# Patient Record
Sex: Female | Born: 1980
Health system: Southern US, Community
[De-identification: ages and names within clinical notes are randomized; demographics above are authoritative.]

## PROBLEM LIST (undated history)

## (undated) DIAGNOSIS — N921 Excessive and frequent menstruation with irregular cycle: Secondary | ICD-10-CM

## (undated) DIAGNOSIS — I10 Essential (primary) hypertension: Secondary | ICD-10-CM

## (undated) DIAGNOSIS — D509 Iron deficiency anemia, unspecified: Secondary | ICD-10-CM

## (undated) DIAGNOSIS — M797 Fibromyalgia: Secondary | ICD-10-CM

## (undated) HISTORY — DX: Iron deficiency anemia, unspecified: D50.9

## (undated) HISTORY — PX: ABDOMINAL HYSTERECTOMY: SHX81

## (undated) HISTORY — DX: Excessive and frequent menstruation with irregular cycle: N92.1

---

## 2014-04-10 ENCOUNTER — Encounter: Payer: Self-pay | Admitting: Sports Medicine

## 2014-04-10 ENCOUNTER — Ambulatory Visit (INDEPENDENT_AMBULATORY_CARE_PROVIDER_SITE_OTHER): Payer: Managed Care, Other (non HMO) | Admitting: Sports Medicine

## 2014-04-10 VITALS — BP 134/87 | HR 82 | Ht 69.0 in | Wt 238.0 lb

## 2014-04-10 DIAGNOSIS — I10 Essential (primary) hypertension: Secondary | ICD-10-CM

## 2014-04-10 DIAGNOSIS — D509 Iron deficiency anemia, unspecified: Secondary | ICD-10-CM

## 2014-04-10 DIAGNOSIS — E611 Iron deficiency: Secondary | ICD-10-CM

## 2014-04-10 DIAGNOSIS — E669 Obesity, unspecified: Secondary | ICD-10-CM

## 2014-04-10 DIAGNOSIS — Z299 Encounter for prophylactic measures, unspecified: Secondary | ICD-10-CM | POA: Insufficient documentation

## 2014-04-10 DIAGNOSIS — Z90710 Acquired absence of both cervix and uterus: Secondary | ICD-10-CM | POA: Insufficient documentation

## 2014-04-10 MED ORDER — LISINOPRIL-HYDROCHLOROTHIAZIDE 10-12.5 MG PO TABS: 1.0000 | ORAL_TABLET | Freq: Every day | ORAL | Status: DC

## 2014-04-10 MED ORDER — SENNOSIDES-DOCUSATE SODIUM 8.6-50 MG PO TABS: 1.0000 | ORAL_TABLET | Freq: Three times a day (TID) | ORAL | Status: DC

## 2014-04-10 MED ORDER — PHENTERMINE HCL 37.5 MG PO CAPS: 37.5000 mg | ORAL_CAPSULE | ORAL | Status: DC

## 2014-04-10 MED ORDER — INTEGRA 62.5-62.5-40-3 MG PO CAPS: 1.0000 | ORAL_CAPSULE | Freq: Three times a day (TID) | ORAL | Status: DC

## 2014-04-10 NOTE — Assessment & Plan Note (Signed)
Continue iron supplementation, increase to 3 times a day with Senokot-S

## 2014-04-10 NOTE — Progress Notes (Signed)
  Subjective:    CC: Establish care.   HPI:  Obesity: Desires to start weight loss treatment.  Hypertension: Stable on lisinopril/hydrochlorothiazide, low dose.  Iron deficiency without anemia: Currently taking Integra iron supplementation but only once a day against recommendations of 3 times a day due to constipation.  Preventive measure: Cervical cancer screening completed in April of this year.  Past medical history, Surgical history, Family history not pertinant except as noted below, Social history, Allergies, and medications have been entered into the medical record, reviewed, and no changes needed.   Review of Systems: No headache, visual changes, nausea, vomiting, diarrhea, constipation, dizziness, abdominal pain, skin rash, fevers, chills, night sweats, swollen lymph nodes, weight loss, chest pain, body aches, joint swelling, muscle aches, shortness of breath, mood changes, visual or auditory hallucinations.  Objective:    General: Well Developed, well nourished, and in no acute distress.  Neuro: Alert and oriented x3, extra-ocular muscles intact, sensation grossly intact.  HEENT: Normocephalic, atraumatic, pupils equal round reactive to light, neck supple, no masses, no lymphadenopathy, thyroid nonpalpable.  Skin: Warm and dry, no rashes noted.  Cardiac: Regular rate and rhythm, no murmurs rubs or gallops.  Respiratory: Clear to auscultation bilaterally. Not using accessory muscles, speaking in full sentences.  Abdominal: Soft, nontender, nondistended, positive bowel sounds, no masses, no organomegaly.  Musculoskeletal: Shoulder, elbow, wrist, hip, knee, ankle stable, and with full range of motion.  Impression and Recommendations:    The patient was counselled, risk factors were discussed, anticipatory guidance given.

## 2014-04-10 NOTE — Assessment & Plan Note (Signed)
phentermine, nutrition referral, exercise prescription given.

## 2014-04-10 NOTE — Assessment & Plan Note (Signed)
Continue lisinopril/hydrochlorothiazide, refilling.

## 2014-04-10 NOTE — Assessment & Plan Note (Signed)
Up-to-date, last Pap smear was April 2015, negative.

## 2014-05-08 ENCOUNTER — Ambulatory Visit (INDEPENDENT_AMBULATORY_CARE_PROVIDER_SITE_OTHER): Payer: Managed Care, Other (non HMO) | Admitting: Sports Medicine

## 2014-05-08 ENCOUNTER — Encounter: Payer: Self-pay | Admitting: Sports Medicine

## 2014-05-08 VITALS — BP 119/84 | HR 97 | Ht 69.0 in | Wt 232.0 lb

## 2014-05-08 DIAGNOSIS — E669 Obesity, unspecified: Secondary | ICD-10-CM

## 2014-05-08 MED ORDER — PHENTERMINE HCL 37.5 MG PO CAPS: 37.5000 mg | ORAL_CAPSULE | ORAL | Status: DC

## 2014-05-08 NOTE — Progress Notes (Signed)
  Subjective:    CC: Followup  HPI: Obesity: 8 pound weight loss in less than a month on phentermine, no side effects.  Past medical history, Surgical history, Family history not pertinant except as noted below, Social history, Allergies, and medications have been entered into the medical record, reviewed, and no changes needed.   Review of Systems: No fevers, chills, night sweats, weight loss, chest pain, or shortness of breath.   Objective:    General: Well Developed, well nourished, and in no acute distress.  Neuro: Alert and oriented x3, extra-ocular muscles intact, sensation grossly intact.  HEENT: Normocephalic, atraumatic, pupils equal round reactive to light, neck supple, no masses, no lymphadenopathy, thyroid nonpalpable.  Skin: Warm and dry, no rashes. Cardiac: Regular rate and rhythm, no murmurs rubs or gallops, no lower extremity edema.  Respiratory: Clear to auscultation bilaterally. Not using accessory muscles, speaking in full sentences.  Impression and Recommendations:

## 2014-05-08 NOTE — Assessment & Plan Note (Signed)
8 pound weight loss in less than one month. Refilling phentermine. Return in one month for a weight check and refills.

## 2014-05-19 ENCOUNTER — Ambulatory Visit: Payer: Managed Care, Other (non HMO) | Admitting: Sports Medicine

## 2014-05-19 ENCOUNTER — Encounter: Payer: Self-pay | Admitting: Sports Medicine

## 2014-05-19 ENCOUNTER — Ambulatory Visit (INDEPENDENT_AMBULATORY_CARE_PROVIDER_SITE_OTHER): Payer: Managed Care, Other (non HMO)

## 2014-05-19 ENCOUNTER — Ambulatory Visit (INDEPENDENT_AMBULATORY_CARE_PROVIDER_SITE_OTHER): Payer: Managed Care, Other (non HMO) | Admitting: Sports Medicine

## 2014-05-19 VITALS — BP 144/88 | HR 81 | Ht 69.0 in | Wt 231.0 lb

## 2014-05-19 DIAGNOSIS — M76829 Posterior tibial tendinitis, unspecified leg: Secondary | ICD-10-CM | POA: Insufficient documentation

## 2014-05-19 DIAGNOSIS — M25579 Pain in unspecified ankle and joints of unspecified foot: Secondary | ICD-10-CM

## 2014-05-19 DIAGNOSIS — M7730 Calcaneal spur, unspecified foot: Secondary | ICD-10-CM

## 2014-05-19 DIAGNOSIS — M25571 Pain in right ankle and joints of right foot: Secondary | ICD-10-CM

## 2014-05-19 MED ORDER — TRAMADOL HCL 50 MG PO TABS: ORAL_TABLET | ORAL | Status: DC

## 2014-05-19 MED ORDER — PREDNISONE 50 MG PO TABS: ORAL_TABLET | ORAL | Status: DC

## 2014-05-19 NOTE — Progress Notes (Signed)
   Subjective:    CC: Right Foot/Ankle pain  HPI: Patient is a previously healthy 33 year old woman with obesity, iron deficiency and hypertension, who comes to the clinic today with intense right ankle pain after stepping off of a chair. She states that she felt sudden intense pain in the ankle and was thereafter unable to bear weight. She also complains of numbness and a cold sensation going out the toes. She says that the pain is throbbing and 10 in intensity. She admits to nausea, denies vomiting. She's tried indomethacin and ibuprofen with little relief  Past medical history, Surgical history, Family history not pertinant except as noted below, Social history, Allergies, and medications have been entered into the medical record, reviewed, and no changes needed.   Review of Systems: No headache, visual changes, nausea, vomiting, diarrhea, constipation, dizziness, abdominal pain, skin rash, fevers, chills, night sweats, weight loss, swollen lymph nodes, body aches, joint swelling, muscle aches, chest pain, shortness of breath, mood changes, visual or auditory hallucinations.   Objective:   General: Tearful and in acute distress Neuro/Psych: Alert and oriented x3, extra-ocular muscles intact, able to move all 4 extremities, sensation grossly intact. Skin: Warm and dry, no rashes noted.  Respiratory: Not using accessory muscles, speaking in full sentences, trachea midline.  Cardiovascular: Pulses palpable, no extremity edema. Abdomen: Does not appear distended. Right Ankle: No visible erythema or swelling. Range of motion dramatically reduced, essentially non-motile Strength is 1-2/5 in all directions. Squeeze test and kleiger test unremarkable; No pain at base of 5th MT; No tenderness over cuboid; No tenderness over N spot or navicular prominence Tenderness on posterior aspects of medial malleolus, there is reproduction of pain along the tibialis posterior with resisted inversion. Not  weight bearing Pronounced guarding.  X-rays negative for fracture.   Impression and Recommendations:    Patient was in too much pain to fully evaluate the foot. Lack of swelling and bruising suggests against fracture. Her injury is likely tendenous, and she will be given a boot to immobilize. Also getting STAT x-rays to evaluate due to intensity of pain response.   This case required medical decision making of moderate complexity.

## 2014-05-19 NOTE — Assessment & Plan Note (Signed)
Significant pain behind the medial malleolus suggestive of a tibialis posterior tendinitis. Strap with compressive dressing. CAM boot, tramadol as needed for pain. She is experiencing some numbness on the bottom of her foot which is likely a secondary tarsal tunnel syndrome. Return to see me in one month. If x-rays are negative for fracture we will add prednisone.

## 2014-05-27 ENCOUNTER — Ambulatory Visit (INDEPENDENT_AMBULATORY_CARE_PROVIDER_SITE_OTHER): Payer: Managed Care, Other (non HMO) | Admitting: Physical Therapy

## 2014-05-27 DIAGNOSIS — M6281 Muscle weakness (generalized): Secondary | ICD-10-CM

## 2014-05-27 DIAGNOSIS — M25673 Stiffness of unspecified ankle, not elsewhere classified: Secondary | ICD-10-CM

## 2014-05-27 DIAGNOSIS — M25676 Stiffness of unspecified foot, not elsewhere classified: Secondary | ICD-10-CM

## 2014-05-27 DIAGNOSIS — M25579 Pain in unspecified ankle and joints of unspecified foot: Secondary | ICD-10-CM

## 2014-05-27 DIAGNOSIS — R269 Unspecified abnormalities of gait and mobility: Secondary | ICD-10-CM

## 2014-05-27 DIAGNOSIS — R609 Edema, unspecified: Secondary | ICD-10-CM

## 2014-05-29 ENCOUNTER — Encounter (INDEPENDENT_AMBULATORY_CARE_PROVIDER_SITE_OTHER): Payer: Managed Care, Other (non HMO) | Admitting: Physical Therapy

## 2014-05-29 DIAGNOSIS — R269 Unspecified abnormalities of gait and mobility: Secondary | ICD-10-CM

## 2014-05-29 DIAGNOSIS — M25676 Stiffness of unspecified foot, not elsewhere classified: Secondary | ICD-10-CM

## 2014-05-29 DIAGNOSIS — M25673 Stiffness of unspecified ankle, not elsewhere classified: Secondary | ICD-10-CM

## 2014-05-29 DIAGNOSIS — R609 Edema, unspecified: Secondary | ICD-10-CM

## 2014-05-29 DIAGNOSIS — M6281 Muscle weakness (generalized): Secondary | ICD-10-CM

## 2014-05-29 DIAGNOSIS — M25579 Pain in unspecified ankle and joints of unspecified foot: Secondary | ICD-10-CM

## 2014-06-02 ENCOUNTER — Encounter (INDEPENDENT_AMBULATORY_CARE_PROVIDER_SITE_OTHER): Payer: Managed Care, Other (non HMO) | Admitting: Physical Therapy

## 2014-06-02 DIAGNOSIS — R269 Unspecified abnormalities of gait and mobility: Secondary | ICD-10-CM

## 2014-06-02 DIAGNOSIS — R609 Edema, unspecified: Secondary | ICD-10-CM

## 2014-06-02 DIAGNOSIS — M25676 Stiffness of unspecified foot, not elsewhere classified: Secondary | ICD-10-CM

## 2014-06-02 DIAGNOSIS — M25579 Pain in unspecified ankle and joints of unspecified foot: Secondary | ICD-10-CM

## 2014-06-02 DIAGNOSIS — M6281 Muscle weakness (generalized): Secondary | ICD-10-CM

## 2014-06-02 DIAGNOSIS — M25673 Stiffness of unspecified ankle, not elsewhere classified: Secondary | ICD-10-CM

## 2014-06-05 ENCOUNTER — Encounter (INDEPENDENT_AMBULATORY_CARE_PROVIDER_SITE_OTHER): Payer: Managed Care, Other (non HMO)

## 2014-06-05 ENCOUNTER — Ambulatory Visit: Payer: Managed Care, Other (non HMO) | Admitting: Sports Medicine

## 2014-06-05 DIAGNOSIS — M25579 Pain in unspecified ankle and joints of unspecified foot: Secondary | ICD-10-CM

## 2014-06-05 DIAGNOSIS — M25676 Stiffness of unspecified foot, not elsewhere classified: Secondary | ICD-10-CM

## 2014-06-05 DIAGNOSIS — M25673 Stiffness of unspecified ankle, not elsewhere classified: Secondary | ICD-10-CM

## 2014-06-05 DIAGNOSIS — M6281 Muscle weakness (generalized): Secondary | ICD-10-CM

## 2014-06-05 DIAGNOSIS — R609 Edema, unspecified: Secondary | ICD-10-CM

## 2014-06-05 DIAGNOSIS — R269 Unspecified abnormalities of gait and mobility: Secondary | ICD-10-CM

## 2014-06-09 ENCOUNTER — Encounter (INDEPENDENT_AMBULATORY_CARE_PROVIDER_SITE_OTHER): Payer: Managed Care, Other (non HMO) | Admitting: Physical Therapy

## 2014-06-09 DIAGNOSIS — R269 Unspecified abnormalities of gait and mobility: Secondary | ICD-10-CM

## 2014-06-09 DIAGNOSIS — M25579 Pain in unspecified ankle and joints of unspecified foot: Secondary | ICD-10-CM

## 2014-06-09 DIAGNOSIS — M25619 Stiffness of unspecified shoulder, not elsewhere classified: Secondary | ICD-10-CM

## 2014-06-09 DIAGNOSIS — R609 Edema, unspecified: Secondary | ICD-10-CM

## 2014-06-09 DIAGNOSIS — M6281 Muscle weakness (generalized): Secondary | ICD-10-CM

## 2014-06-12 ENCOUNTER — Encounter: Payer: Managed Care, Other (non HMO) | Admitting: Physical Therapy

## 2014-06-17 ENCOUNTER — Encounter (INDEPENDENT_AMBULATORY_CARE_PROVIDER_SITE_OTHER): Payer: Managed Care, Other (non HMO) | Admitting: Physical Therapy

## 2014-06-17 DIAGNOSIS — M25579 Pain in unspecified ankle and joints of unspecified foot: Secondary | ICD-10-CM

## 2014-06-17 DIAGNOSIS — M6281 Muscle weakness (generalized): Secondary | ICD-10-CM

## 2014-06-17 DIAGNOSIS — R609 Edema, unspecified: Secondary | ICD-10-CM

## 2014-06-17 DIAGNOSIS — M25673 Stiffness of unspecified ankle, not elsewhere classified: Secondary | ICD-10-CM

## 2014-06-17 DIAGNOSIS — M25676 Stiffness of unspecified foot, not elsewhere classified: Secondary | ICD-10-CM

## 2014-06-17 DIAGNOSIS — R269 Unspecified abnormalities of gait and mobility: Secondary | ICD-10-CM

## 2014-06-19 ENCOUNTER — Encounter (INDEPENDENT_AMBULATORY_CARE_PROVIDER_SITE_OTHER): Payer: Managed Care, Other (non HMO) | Admitting: Physical Therapy

## 2014-06-19 DIAGNOSIS — R609 Edema, unspecified: Secondary | ICD-10-CM

## 2014-06-19 DIAGNOSIS — M25673 Stiffness of unspecified ankle, not elsewhere classified: Secondary | ICD-10-CM

## 2014-06-19 DIAGNOSIS — M25579 Pain in unspecified ankle and joints of unspecified foot: Secondary | ICD-10-CM

## 2014-06-19 DIAGNOSIS — M25676 Stiffness of unspecified foot, not elsewhere classified: Secondary | ICD-10-CM

## 2014-06-19 DIAGNOSIS — M6281 Muscle weakness (generalized): Secondary | ICD-10-CM

## 2014-06-19 DIAGNOSIS — R269 Unspecified abnormalities of gait and mobility: Secondary | ICD-10-CM

## 2014-06-20 ENCOUNTER — Ambulatory Visit (INDEPENDENT_AMBULATORY_CARE_PROVIDER_SITE_OTHER): Payer: Managed Care, Other (non HMO) | Admitting: Sports Medicine

## 2014-06-20 ENCOUNTER — Encounter: Payer: Self-pay | Admitting: Sports Medicine

## 2014-06-20 VITALS — BP 131/91 | HR 98 | Ht 69.0 in | Wt 229.0 lb

## 2014-06-20 DIAGNOSIS — M76821 Posterior tibial tendinitis, right leg: Secondary | ICD-10-CM

## 2014-06-20 DIAGNOSIS — M76829 Posterior tibial tendinitis, unspecified leg: Secondary | ICD-10-CM

## 2014-06-20 DIAGNOSIS — E669 Obesity, unspecified: Secondary | ICD-10-CM

## 2014-06-20 MED ORDER — PHENTERMINE HCL 37.5 MG PO CAPS: ORAL_CAPSULE | ORAL | Status: DC

## 2014-06-20 NOTE — Assessment & Plan Note (Signed)
Resolved with CAM boot immobilization and rehabilitation. Return for custom orthotics.

## 2014-06-20 NOTE — Progress Notes (Signed)
  Subjective:    CC: Followup  HPI: Right tibialis posterior tendinitis: Now resolved with formal physical therapy, prednisone.  Obesity: Desires to restart phentermine.  Past medical history, Surgical history, Family history not pertinant except as noted below, Social history, Allergies, and medications have been entered into the medical record, reviewed, and no changes needed.   Review of Systems: No fevers, chills, night sweats, weight loss, chest pain, or shortness of breath.   Objective:    General: Well Developed, well nourished, and in no acute distress.  Neuro: Alert and oriented x3, extra-ocular muscles intact, sensation grossly intact.  HEENT: Normocephalic, atraumatic, pupils equal round reactive to light, neck supple, no masses, no lymphadenopathy, thyroid nonpalpable.  Skin: Warm and dry, no rashes. Cardiac: Regular rate and rhythm, no murmurs rubs or gallops, no lower extremity edema.  Respiratory: Clear to auscultation bilaterally. Not using accessory muscles, speaking in full sentences. Right Ankle: No visible erythema or swelling. Range of motion is full in all directions. Strength is 5/5 in all directions. Stable lateral and medial ligaments; squeeze test and kleiger test unremarkable; Talar dome nontender; No pain at base of 5th MT; No tenderness over cuboid; No tenderness over N spot or navicular prominence No tenderness on posterior aspects of lateral and medial malleolus No sign of peroneal tendon subluxations; Negative tarsal tunnel tinel's Able to walk 4 steps.  Impression and Recommendations:

## 2014-06-20 NOTE — Assessment & Plan Note (Signed)
Restart phentermine. 

## 2014-06-23 ENCOUNTER — Encounter (INDEPENDENT_AMBULATORY_CARE_PROVIDER_SITE_OTHER): Payer: Managed Care, Other (non HMO) | Admitting: Physical Therapy

## 2014-06-23 DIAGNOSIS — R269 Unspecified abnormalities of gait and mobility: Secondary | ICD-10-CM

## 2014-06-23 DIAGNOSIS — R609 Edema, unspecified: Secondary | ICD-10-CM

## 2014-06-23 DIAGNOSIS — M25676 Stiffness of unspecified foot, not elsewhere classified: Secondary | ICD-10-CM

## 2014-06-23 DIAGNOSIS — M25579 Pain in unspecified ankle and joints of unspecified foot: Secondary | ICD-10-CM

## 2014-06-23 DIAGNOSIS — M25673 Stiffness of unspecified ankle, not elsewhere classified: Secondary | ICD-10-CM

## 2014-06-23 DIAGNOSIS — M6281 Muscle weakness (generalized): Secondary | ICD-10-CM

## 2014-06-26 ENCOUNTER — Encounter (INDEPENDENT_AMBULATORY_CARE_PROVIDER_SITE_OTHER): Payer: Managed Care, Other (non HMO) | Admitting: Physical Therapy

## 2014-06-26 DIAGNOSIS — M25579 Pain in unspecified ankle and joints of unspecified foot: Secondary | ICD-10-CM

## 2014-06-26 DIAGNOSIS — M25673 Stiffness of unspecified ankle, not elsewhere classified: Secondary | ICD-10-CM

## 2014-06-26 DIAGNOSIS — M6281 Muscle weakness (generalized): Secondary | ICD-10-CM

## 2014-06-26 DIAGNOSIS — M25676 Stiffness of unspecified foot, not elsewhere classified: Secondary | ICD-10-CM

## 2014-06-26 DIAGNOSIS — R609 Edema, unspecified: Secondary | ICD-10-CM

## 2014-06-26 DIAGNOSIS — R269 Unspecified abnormalities of gait and mobility: Secondary | ICD-10-CM

## 2014-06-30 ENCOUNTER — Encounter (INDEPENDENT_AMBULATORY_CARE_PROVIDER_SITE_OTHER): Payer: Managed Care, Other (non HMO) | Admitting: Physical Therapy

## 2014-06-30 DIAGNOSIS — R269 Unspecified abnormalities of gait and mobility: Secondary | ICD-10-CM

## 2014-06-30 DIAGNOSIS — M25676 Stiffness of unspecified foot, not elsewhere classified: Secondary | ICD-10-CM

## 2014-06-30 DIAGNOSIS — M25673 Stiffness of unspecified ankle, not elsewhere classified: Secondary | ICD-10-CM

## 2014-06-30 DIAGNOSIS — M6281 Muscle weakness (generalized): Secondary | ICD-10-CM

## 2014-06-30 DIAGNOSIS — R609 Edema, unspecified: Secondary | ICD-10-CM

## 2014-06-30 DIAGNOSIS — M25579 Pain in unspecified ankle and joints of unspecified foot: Secondary | ICD-10-CM

## 2014-07-03 ENCOUNTER — Encounter: Payer: Managed Care, Other (non HMO) | Admitting: Sports Medicine

## 2014-07-03 ENCOUNTER — Encounter (INDEPENDENT_AMBULATORY_CARE_PROVIDER_SITE_OTHER): Payer: Managed Care, Other (non HMO) | Admitting: Physical Therapy

## 2014-07-03 DIAGNOSIS — R269 Unspecified abnormalities of gait and mobility: Secondary | ICD-10-CM

## 2014-07-03 DIAGNOSIS — R609 Edema, unspecified: Secondary | ICD-10-CM

## 2014-07-03 DIAGNOSIS — M6281 Muscle weakness (generalized): Secondary | ICD-10-CM

## 2014-07-03 DIAGNOSIS — M25673 Stiffness of unspecified ankle, not elsewhere classified: Secondary | ICD-10-CM

## 2014-07-03 DIAGNOSIS — M25676 Stiffness of unspecified foot, not elsewhere classified: Secondary | ICD-10-CM

## 2014-07-03 DIAGNOSIS — M25579 Pain in unspecified ankle and joints of unspecified foot: Secondary | ICD-10-CM

## 2014-07-11 ENCOUNTER — Encounter: Payer: Managed Care, Other (non HMO) | Admitting: Sports Medicine

## 2014-07-18 ENCOUNTER — Ambulatory Visit: Payer: Managed Care, Other (non HMO) | Admitting: Sports Medicine

## 2014-08-05 ENCOUNTER — Ambulatory Visit (INDEPENDENT_AMBULATORY_CARE_PROVIDER_SITE_OTHER): Payer: Managed Care, Other (non HMO) | Admitting: Sports Medicine

## 2014-08-05 ENCOUNTER — Encounter: Payer: Self-pay | Admitting: Sports Medicine

## 2014-08-05 VITALS — BP 135/79 | HR 99 | Wt 212.0 lb

## 2014-08-05 DIAGNOSIS — M76821 Posterior tibial tendinitis, right leg: Secondary | ICD-10-CM

## 2014-08-05 DIAGNOSIS — E669 Obesity, unspecified: Secondary | ICD-10-CM

## 2014-08-05 DIAGNOSIS — E611 Iron deficiency: Secondary | ICD-10-CM

## 2014-08-05 MED ORDER — FERROUS FUMARATE 325 (106 FE) MG PO TABS: 1.0000 | ORAL_TABLET | Freq: Two times a day (BID) | ORAL | Status: DC

## 2014-08-05 MED ORDER — PHENTERMINE HCL 37.5 MG PO CAPS: ORAL_CAPSULE | ORAL | Status: DC

## 2014-08-05 NOTE — Assessment & Plan Note (Signed)
Fantastic weight loss, 12 pounds in the last month. Refilling phentermine. Return in one month for a weight check and refills.

## 2014-08-05 NOTE — Addendum Note (Signed)
Addended by: Monica BectonHEKKEKANDAM, Jameshia Hayashida J on: 08/05/2014 10:02 AM   Modules accepted: Orders, Medications

## 2014-08-05 NOTE — Progress Notes (Signed)

## 2014-08-05 NOTE — Assessment & Plan Note (Signed)
Custom orthotics as above. Continue rehabilitation exercises. 

## 2014-08-05 NOTE — Assessment & Plan Note (Signed)
Unfortunately has not been doing iron supplementation. Did have some GI upset, switching to ferrous fumarate.

## 2014-09-03 ENCOUNTER — Ambulatory Visit: Payer: Managed Care, Other (non HMO) | Admitting: Sports Medicine

## 2014-11-18 ENCOUNTER — Encounter: Payer: Self-pay | Admitting: Sports Medicine

## 2014-11-18 ENCOUNTER — Ambulatory Visit (INDEPENDENT_AMBULATORY_CARE_PROVIDER_SITE_OTHER): Payer: 59 | Admitting: Sports Medicine

## 2014-11-18 VITALS — BP 155/94 | HR 87 | Ht 69.0 in | Wt 241.0 lb

## 2014-11-18 DIAGNOSIS — I10 Essential (primary) hypertension: Secondary | ICD-10-CM

## 2014-11-18 DIAGNOSIS — M7918 Myalgia, other site: Secondary | ICD-10-CM | POA: Insufficient documentation

## 2014-11-18 DIAGNOSIS — E611 Iron deficiency: Secondary | ICD-10-CM

## 2014-11-18 DIAGNOSIS — Z299 Encounter for prophylactic measures, unspecified: Secondary | ICD-10-CM

## 2014-11-18 DIAGNOSIS — E669 Obesity, unspecified: Secondary | ICD-10-CM

## 2014-11-18 DIAGNOSIS — M791 Myalgia: Secondary | ICD-10-CM

## 2014-11-18 DIAGNOSIS — Z418 Encounter for other procedures for purposes other than remedying health state: Secondary | ICD-10-CM

## 2014-11-18 MED ORDER — DULOXETINE HCL 30 MG PO CPEP: 30.0000 mg | ORAL_CAPSULE | Freq: Every day | ORAL | Status: DC

## 2014-11-18 MED ORDER — LISINOPRIL-HYDROCHLOROTHIAZIDE 20-25 MG PO TABS: 1.0000 | ORAL_TABLET | Freq: Every day | ORAL | Status: DC

## 2014-11-18 NOTE — Assessment & Plan Note (Addendum)
Pain all over, depressed mood, stress. Skin is hyperesthetic, to the point where her husband cannot even touch her. Starting low-dose Cymbalta.  Return in one month.

## 2014-11-18 NOTE — Assessment & Plan Note (Signed)
Increasing lisinopril/hydrochlorothiazide to maximum dose. Return in one month.

## 2014-11-18 NOTE — Assessment & Plan Note (Signed)
There is some suspicion of PCOS with facial rash. Checking testosterone levels.

## 2014-11-18 NOTE — Assessment & Plan Note (Signed)
We can consider restarting phentermine once blood pressure is under control.

## 2014-11-18 NOTE — Assessment & Plan Note (Addendum)
Continued fatigue, microcytosis on a recent screening. Considering her microcytosis I am also going to add a hemoglobin electrophoresis. Unable to tolerate oral iron supplementation with either ferrous sulfate or ferrous fumarate. At this point I think she would be a candidate for IV iron infusion, referral to hematology for this.

## 2014-11-18 NOTE — Progress Notes (Signed)
  Subjective:    CC: follow-up  HPI: Muscle aches: Barbara Mcdaniel is very busy, she is been traveling a lot, unfortunately she has become extremely stressed,and she tells me she has muscle aches and body aches all over her body in every single muscle, she tells me that her pain and sensitivity is so great that her husband cannot even touch her without significant pain. On further questioning she does endorse severe anhedonia, depressed mood, difficulty sleeping, and loss of energy as well as mild changes in her appetite, guilt, trouble concentrating, and psychomotor retardation, no suicidal or homicidal ideation, she also endorses severe nervousness and anxiety, worry, difficulty relaxing, mild restlessness, and severe irritability.  Obesity: Unfortunately has stopped and never followed up with her phentermine.  Skin rash: Localized on her chin, gets several ingrown hairs, has a family history of polycystic ovary syndrome, has never been tested.  Iron deficiency: She does feel fatigued and weak, on a previous lab reports she was anemic, and she did have relatively low iron stores and she did have microcytosis.  We have not checked a hemoglobin electrophoresis. Unfortunately she is unable to tolerate her iron in multiple forms.  Past medical history, Surgical history, Family history not pertinant except as noted below, Social history, Allergies, and medications have been entered into the medical record, reviewed, and no changes needed.   Review of Systems: No fevers, chills, night sweats, weight loss, chest pain, or shortness of breath.   Objective:    General: Well Developed, well nourished, and in no acute distress.  Neuro: Alert and oriented x3, extra-ocular muscles intact, sensation grossly intact.  HEENT: Normocephalic, atraumatic, pupils equal round reactive to light, neck supple, no masses, no lymphadenopathy, thyroid nonpalpable.  Skin: Warm and dry, no rashes. Cardiac: Regular rate and  rhythm, no murmurs rubs or gallops, no lower extremity edema.  Respiratory: Clear to auscultation bilaterally. Not using accessory muscles, speaking in full sentences.  Impression and Recommendations:

## 2014-11-19 LAB — FERRITIN: Ferritin: 14 ng/mL (ref 10–291)

## 2014-11-19 LAB — CBC
HCT: 35.8 % — ABNORMAL LOW (ref 36.0–46.0)
Hemoglobin: 11.2 g/dL — ABNORMAL LOW (ref 12.0–15.0)
MCH: 23.9 pg — ABNORMAL LOW (ref 26.0–34.0)
MCHC: 31.3 g/dL (ref 30.0–36.0)
MCV: 76.3 fL — ABNORMAL LOW (ref 78.0–100.0)
MPV: 8.8 fL (ref 8.6–12.4)
Platelets: 397 10*3/uL (ref 150–400)
RBC: 4.69 MIL/uL (ref 3.87–5.11)
RDW: 16.3 % — ABNORMAL HIGH (ref 11.5–15.5)
WBC: 6.4 10*3/uL (ref 4.0–10.5)

## 2014-11-19 LAB — IRON AND TIBC
%SAT: 7 % — ABNORMAL LOW (ref 20–55)
Iron: 26 ug/dL — ABNORMAL LOW (ref 42–145)
TIBC: 376 ug/dL (ref 250–470)
UIBC: 350 ug/dL (ref 125–400)

## 2014-11-19 LAB — TESTOSTERONE, FREE, TOTAL, SHBG
Sex Hormone Binding: 72 nmol/L (ref 17–124)
Testosterone, Free: 5.5 pg/mL (ref 0.6–6.8)
Testosterone-% Free: 1.1 % (ref 0.4–2.4)
Testosterone: 52 ng/dL (ref 10–70)

## 2014-11-19 LAB — ANA: Anti Nuclear Antibody(ANA): NEGATIVE

## 2014-11-19 LAB — FOLATE: Folate: >20 ng/mL

## 2014-11-19 LAB — VITAMIN B12: Vitamin B-12: 724 pg/mL (ref 211–911)

## 2014-11-19 NOTE — Progress Notes (Signed)
Lab has been informed to add the recent test that was ordered. Rhonda Cunningham,CMA

## 2014-11-20 LAB — HEMOGLOBINOPATHY EVALUATION
Hemoglobin Other: 0 %
Hgb A2 Quant: 2.4 % (ref 2.2–3.2)
Hgb A: 97.6 % (ref 96.8–97.8)
Hgb F Quant: 0 % (ref 0.0–2.0)
Hgb S Quant: 0 %

## 2014-11-28 ENCOUNTER — Ambulatory Visit: Payer: 59 | Admitting: Family

## 2014-11-28 ENCOUNTER — Other Ambulatory Visit: Payer: 59 | Admitting: Lab

## 2014-11-28 ENCOUNTER — Ambulatory Visit: Payer: 59

## 2014-12-05 ENCOUNTER — Telehealth: Payer: Self-pay | Admitting: Hematology & Oncology

## 2014-12-05 NOTE — Telephone Encounter (Signed)
I spoke w NEW PATIENT today to remind them of their appointment with Dr. Ennever. Also, advised them to bring all medication bottles and insurance card information. ° °

## 2014-12-08 ENCOUNTER — Encounter: Payer: Self-pay | Admitting: Family

## 2014-12-08 ENCOUNTER — Ambulatory Visit: Payer: 59

## 2014-12-08 ENCOUNTER — Other Ambulatory Visit: Payer: Self-pay | Admitting: Family

## 2014-12-08 ENCOUNTER — Ambulatory Visit (HOSPITAL_BASED_OUTPATIENT_CLINIC_OR_DEPARTMENT_OTHER): Payer: 59 | Admitting: Family

## 2014-12-08 ENCOUNTER — Ambulatory Visit (HOSPITAL_BASED_OUTPATIENT_CLINIC_OR_DEPARTMENT_OTHER): Payer: 59

## 2014-12-08 ENCOUNTER — Other Ambulatory Visit (HOSPITAL_BASED_OUTPATIENT_CLINIC_OR_DEPARTMENT_OTHER): Payer: 59 | Admitting: Lab

## 2014-12-08 VITALS — BP 119/70 | HR 68

## 2014-12-08 DIAGNOSIS — N920 Excessive and frequent menstruation with regular cycle: Secondary | ICD-10-CM

## 2014-12-08 DIAGNOSIS — E611 Iron deficiency: Secondary | ICD-10-CM

## 2014-12-08 DIAGNOSIS — R040 Epistaxis: Secondary | ICD-10-CM

## 2014-12-08 DIAGNOSIS — R42 Dizziness and giddiness: Secondary | ICD-10-CM

## 2014-12-08 LAB — FERRITIN CHCC: Ferritin: 16 ng/ml (ref 9–269)

## 2014-12-08 LAB — CBC WITH DIFFERENTIAL (CANCER CENTER ONLY)
BASO#: 0 10*3/uL (ref 0.0–0.2)
BASO%: 0.4 % (ref 0.0–2.0)
EOS%: 0.6 % (ref 0.0–7.0)
Eosinophils Absolute: 0 10*3/uL (ref 0.0–0.5)
HEMATOCRIT: 34.7 % — AB (ref 34.8–46.6)
HGB: 11.2 g/dL — ABNORMAL LOW (ref 11.6–15.9)
LYMPH#: 1.7 10*3/uL (ref 0.9–3.3)
LYMPH%: 35.9 % (ref 14.0–48.0)
MCH: 24 pg — ABNORMAL LOW (ref 26.0–34.0)
MCHC: 32.3 g/dL (ref 32.0–36.0)
MCV: 74 fL — AB (ref 81–101)
MONO#: 0.4 10*3/uL (ref 0.1–0.9)
MONO%: 9 % (ref 0.0–13.0)
NEUT#: 2.6 10*3/uL (ref 1.5–6.5)
NEUT%: 54.1 % (ref 39.6–80.0)
Platelets: 330 10*3/uL (ref 145–400)
RBC: 4.67 10*6/uL (ref 3.70–5.32)
RDW: 16.3 % — AB (ref 11.1–15.7)
WBC: 4.8 10*3/uL (ref 3.9–10.0)

## 2014-12-08 LAB — IRON AND TIBC CHCC
%SAT: 17 % — ABNORMAL LOW (ref 21–57)
Iron: 56 ug/dL (ref 41–142)
TIBC: 340 ug/dL (ref 236–444)
UIBC: 284 ug/dL (ref 120–384)

## 2014-12-08 LAB — CHCC SATELLITE - SMEAR

## 2014-12-08 MED ORDER — SODIUM CHLORIDE 0.9 % IV SOLN
510.0000 mg | Freq: Once | INTRAVENOUS | Status: AC
  Administered 2014-12-08: 510 mg via INTRAVENOUS
  Filled 2014-12-08: qty 17

## 2014-12-08 NOTE — Patient Instructions (Signed)

## 2014-12-08 NOTE — Progress Notes (Signed)
Hematology/Oncology Consultation   Name: Barbara Mcdaniel      MRN: [id number]    Location: Room/bed info not found  Date: 12/08/2014 Time:11:01 AM   REFERRING PHYSICIAN:  Rodney Langton   REASON FOR CONSULT:  Persistent microcytosis, iron deficiency without anemia, unable to tolerate oral iron    DIAGNOSIS: Iron deficiency   HISTORY OF PRESENT ILLNESS: [name] is a very pleasant 34 yo [place] American female with a history of iron deficiency.  She has very heavy cycles with some large clots. She experiences nose bleeds and headaches before and after her cycles.  She has dizzy spells and has passed out. She gets a salty taste in her mouth before she passes out.  She does chew ice and stays thirsty "all the time".  She feels tired. She has tried taking oral iron but it causes constipation so she stopped taking it.  She had 2 children by c-section. Her first son was premature and has epilepsy. She denies any histpry of bleeding or clotting disorders. No sickle cell.  Her sister has Lupus and is also anemic.  There is no history of cancer in the family.  She denies fever, chills, n/v, cough, SOB, chest pain, palpitations, abdominal pain, constipation diarrhea, blood in urine or stool.  No swelling, tenderness, numbness or tingling in her extremities. No new aches or pains.  Her appetite is ok and she drinks lots of fluids.  She does not smoke and only has a glass of wine on occasion.  She works for [employer].   ROS: All other 10 point review of systems is negative.   PAST MEDICAL HISTORY:   History reviewed. No pertinent past medical history.  ALLERGIES: No Known Allergies    MEDICATIONS:  Current Outpatient Prescriptions on File Prior to Visit  Medication Sig Dispense Refill  . DULoxetine (CYMBALTA) 30 MG capsule Take 1 capsule (30 mg total) by mouth daily. 90 capsule 0  . lisinopril-hydrochlorothiazide (PRINZIDE,ZESTORETIC) 20-25 MG per tablet Take 1 tablet by mouth  daily. 90 tablet 3   No current facility-administered medications on file prior to visit.     PAST SURGICAL HISTORY Past Surgical History  Procedure Laterality Date  . Cesarean section      07/16/2000 and 06/29/2004    FAMILY HISTORY: Family History  Problem Relation Age of Onset  . Hypertension Mother   . Hyperlipidemia Mother   . Hypertension Father   . Hypertension Sister   . Diabetes Maternal Aunt   . Diabetes Maternal Uncle   . Hypertension Maternal Uncle   . Stroke Maternal Uncle   . Heart disease Maternal Grandfather     SOCIAL HISTORY:  reports that she has never smoked. She does not have any smokeless tobacco history on file. She reports that she drinks alcohol. Her drug history is not on file.  PERFORMANCE STATUS: The patient's performance status is 1 - Symptomatic but completely ambulatory  PHYSICAL EXAM: Most Recent Vital Signs: Blood pressure 121/76, pulse 68, temperature 98.7 F (37.1 C), temperature source Oral, resp. rate 14, height  (1.753 m), weight 240 lb (108.863 kg). BP 121/76 mmHg  Pulse 68  Temp(Src) 98.7 F (37.1 C) (Oral)  Resp 14  Ht  (1.753 m)  Wt 240 lb (108.863 kg)  BMI 35.43 kg/m2  General Appearance:    Alert, cooperative, no distress, appears stated age  Head:    Normocephalic, without obvious abnormality, atraumatic  Eyes:    PERRL, conjunctiva/corneas clear, EOM's intact, fundi  benign, both eyes        Throat:   Lips, mucosa, and tongue normal; teeth and gums normal  Neck:   Supple, symmetrical, trachea midline, no adenopathy;    thyroid:  no enlargement/tenderness/nodules; no carotid   bruit or JVD  Back:     Symmetric, no curvature, ROM normal, no CVA tenderness  Lungs:     Clear to auscultation bilaterally, respirations unlabored  Chest Wall:    No tenderness or deformity   Heart:    Regular rate and rhythm, S1 and S2 normal, no murmur, rub   or gallop     Abdomen:     Soft, non-tender, bowel sounds active  all four quadrants,    no masses, no organomegaly        Extremities:   Extremities normal, atraumatic, no cyanosis or edema  Pulses:   2+ and symmetric all extremities  Skin:   Skin color, texture, turgor normal, no rashes or lesions  Lymph nodes:   Cervical, supraclavicular, and axillary nodes normal  Neurologic:   CNII-XII intact, normal strength, sensation and reflexes    throughout   LABORATORY DATA:  Results for orders placed or performed in visit on 12/08/14 (from the past 48 hour(s))  CBC with Differential [facility])     Status: Abnormal   Collection Time: 12/08/14  9:39 AM  Result Value Ref Range   WBC 4.8 3.9 - 10.0 10e3/uL   RBC 4.67 3.70 - 5.32 10e6/uL   HGB 11.2 (L) 11.6 - 15.9 g/dL   HCT 29.5 (L) 62.1 - 30.8 %   MCV 74 (L) 81 - 101 fL   MCH 24.0 (L) 26.0 - 34.0 pg   MCHC 32.3 32.0 - 36.0 g/dL   RDW 65.7 (H) 84.6 - 96.2 %   Platelets 330 145 - 400 10e3/uL   NEUT# 2.6 1.5 - 6.5 10e3/uL   LYMPH# 1.7 0.9 - 3.3 10e3/uL   MONO# 0.4 0.1 - 0.9 10e3/uL   Eosinophils Absolute 0.0 0.0 - 0.5 10e3/uL   BASO# 0.0 0.0 - 0.2 10e3/uL   NEUT% 54.1 39.6 - 80.0 %   LYMPH% 35.9 14.0 - 48.0 %   MONO% 9.0 0.0 - 13.0 %   EOS% 0.6 0.0 - 7.0 %   BASO% 0.4 0.0 - 2.0 %  [facility] - Smear     Status: None   Collection Time: 12/08/14  9:39 AM  Result Value Ref Range   Smear Result Smear Available       RADIOGRAPHY: No results found.     PATHOLOGY: None  ASSESSMENT/PLAN: [name] is a very pleasant 34 yo [place] American female with a history of iron deficiency. She has heavy cycles with nosebleeds and dizziness. She has passed out a few times. She also c/o fatigue and chew a lot of ice.  Last week her iron saturation was 7% and ferritin was 14. Her Hgb today s 11.2 and MCV is 74. Dr. Myna Hidalgo will view her blood smear.  We will give her a dose of Fereheme today and again in 8 days.  We will see her back in 1 month for labs and follow-up.   All questions were answered.  She knows to call here with any other questions or concerns and to go to the ED in the event of an emergency. We can certainly see her sooner if need be.  The patient was discussed with Dr. Myna Hidalgo and he is in agreement with the aforementioned.   Rylie Limburg  M

## 2014-12-10 LAB — HEMOGLOBINOPATHY EVALUATION
HGB A: 97.5 % (ref 96.8–97.8)
Hemoglobin Other: 0 %
Hgb A2 Quant: 2.5 % (ref 2.2–3.2)
Hgb F Quant: 0 % (ref 0.0–2.0)
Hgb S Quant: 0 %

## 2014-12-10 LAB — RETICULOCYTES (CHCC)
ABS Retic: 47 10*3/uL (ref 19.0–186.0)
RBC.: 4.7 MIL/uL (ref 3.87–5.11)
RETIC CT PCT: 1 % (ref 0.4–2.3)

## 2014-12-16 ENCOUNTER — Ambulatory Visit: Payer: 59

## 2014-12-17 ENCOUNTER — Ambulatory Visit: Payer: 59 | Admitting: Sports Medicine

## 2014-12-18 ENCOUNTER — Ambulatory Visit (HOSPITAL_BASED_OUTPATIENT_CLINIC_OR_DEPARTMENT_OTHER): Payer: 59

## 2014-12-18 VITALS — BP 113/68 | HR 69 | Temp 98.2°F | Resp 18

## 2014-12-18 DIAGNOSIS — E611 Iron deficiency: Secondary | ICD-10-CM

## 2014-12-18 MED ORDER — SODIUM CHLORIDE 0.9 % IV SOLN
510.0000 mg | Freq: Once | INTRAVENOUS | Status: AC
  Administered 2014-12-18: 510 mg via INTRAVENOUS
  Filled 2014-12-18: qty 17

## 2014-12-18 MED ORDER — SODIUM CHLORIDE 0.9 % IV SOLN
INTRAVENOUS | Status: DC
  Administered 2014-12-18: 10:00:00 via INTRAVENOUS

## 2014-12-18 NOTE — Patient Instructions (Signed)

## 2014-12-19 LAB — HM PAP SMEAR: HM PAP: NEGATIVE

## 2014-12-23 ENCOUNTER — Encounter: Payer: Self-pay | Admitting: Sports Medicine

## 2014-12-23 ENCOUNTER — Ambulatory Visit (INDEPENDENT_AMBULATORY_CARE_PROVIDER_SITE_OTHER): Payer: 59 | Admitting: Sports Medicine

## 2014-12-23 VITALS — BP 116/80 | HR 73 | Wt 242.0 lb

## 2014-12-23 DIAGNOSIS — I10 Essential (primary) hypertension: Secondary | ICD-10-CM

## 2014-12-23 DIAGNOSIS — M791 Myalgia: Secondary | ICD-10-CM

## 2014-12-23 DIAGNOSIS — M7918 Myalgia, other site: Secondary | ICD-10-CM

## 2014-12-23 DIAGNOSIS — E611 Iron deficiency: Secondary | ICD-10-CM | POA: Diagnosis not present

## 2014-12-23 LAB — CBC
HCT: 35.5 % — ABNORMAL LOW (ref 36.0–46.0)
Hemoglobin: 11.1 g/dL — ABNORMAL LOW (ref 12.0–15.0)
MCH: 24.2 pg — ABNORMAL LOW (ref 26.0–34.0)
MCHC: 31.3 g/dL (ref 30.0–36.0)
MCV: 77.3 fL — ABNORMAL LOW (ref 78.0–100.0)
MPV: 8.2 fL — ABNORMAL LOW (ref 8.6–12.4)
Platelets: 375 10*3/uL (ref 150–400)
RBC: 4.59 MIL/uL (ref 3.87–5.11)
RDW: 17.8 % — ABNORMAL HIGH (ref 11.5–15.5)
WBC: 6.6 K/uL (ref 4.0–10.5)

## 2014-12-23 LAB — FERRITIN: Ferritin: 632 ng/mL — ABNORMAL HIGH (ref 10–291)

## 2014-12-23 LAB — TSH: TSH: 1.188 u[IU]/mL (ref 0.350–4.500)

## 2014-12-23 LAB — VITAMIN B12: Vitamin B-12: 542 pg/mL (ref 211–911)

## 2014-12-23 LAB — IRON AND TIBC
%SAT: 47 % (ref 20–55)
Iron: 151 ug/dL — ABNORMAL HIGH (ref 42–145)
TIBC: 322 ug/dL (ref 250–470)
UIBC: 171 ug/dL (ref 125–400)

## 2014-12-23 LAB — FOLATE: Folate: >20 ng/mL

## 2014-12-23 MED ORDER — DULOXETINE HCL 60 MG PO CPEP: 60.0000 mg | ORAL_CAPSULE | Freq: Every day | ORAL | Status: DC

## 2014-12-23 NOTE — Assessment & Plan Note (Signed)
Rechecking CBC, anemia panel, TSH, she is post to iron infusions and is going to be scheduled for total hysterectomy.

## 2014-12-23 NOTE — Progress Notes (Signed)
  Subjective:    CC: Follow-up  HPI: Anemia: Has had 2 iron infusions, only feels a little bit better. Has not yet had her CBC or iron studies checked. She does have menometrorrhagia, she will need a hysterectomy for her OB/GYN.  Depression/myofascial pain: Improved on 30 mg of Cymbalta, myalgias are now resolved, continues to have moderate nervousness, worry, and trouble relaxing as well as mild irritability, has severe difficulty sleeping, poor energy, and poor concentration, moderate anhedonia, and change in appetite. No suicidal or homicidal ideation. Amenable to increase her dose.  Hypertension: Well controlled now on lisinopril/hydrochlorothiazide.  Past medical history, Surgical history, Family history not pertinant except as noted below, Social history, Allergies, and medications have been entered into the medical record, reviewed, and no changes needed.   Review of Systems: No fevers, chills, night sweats, weight loss, chest pain, or shortness of breath.   Objective:    General: Well Developed, well nourished, and in no acute distress.  Neuro: Alert and oriented x3, extra-ocular muscles intact, sensation grossly intact.  HEENT: Normocephalic, atraumatic, pupils equal round reactive to light, neck supple, no masses, no lymphadenopathy, thyroid nonpalpable.  Skin: Warm and dry, no rashes. Cardiac: Regular rate and rhythm, no murmurs rubs or gallops, no lower extremity edema.  Respiratory: Clear to auscultation bilaterally. Not using accessory muscles, speaking in full sentences.  Impression and Recommendations:

## 2014-12-23 NOTE — Assessment & Plan Note (Signed)
Beautifully controlled now on lisinopril/hydrochlorothiazide

## 2014-12-23 NOTE — Assessment & Plan Note (Signed)
Widespread muscle pains have resolved with Cymbalta, her husband is now able to touch her. Increasing Cymbalta to 60 mg, she still feels somewhat fatigued. Return in one month.

## 2015-01-05 ENCOUNTER — Other Ambulatory Visit (HOSPITAL_BASED_OUTPATIENT_CLINIC_OR_DEPARTMENT_OTHER): Payer: 59

## 2015-01-05 ENCOUNTER — Encounter: Payer: Self-pay | Admitting: Hematology & Oncology

## 2015-01-05 ENCOUNTER — Ambulatory Visit (HOSPITAL_BASED_OUTPATIENT_CLINIC_OR_DEPARTMENT_OTHER): Payer: 59 | Admitting: Hematology & Oncology

## 2015-01-05 VITALS — BP 114/67 | HR 85 | Temp 98.8°F | Resp 14 | Ht 69.0 in | Wt 240.0 lb

## 2015-01-05 DIAGNOSIS — E611 Iron deficiency: Secondary | ICD-10-CM

## 2015-01-05 DIAGNOSIS — D509 Iron deficiency anemia, unspecified: Secondary | ICD-10-CM

## 2015-01-05 DIAGNOSIS — N921 Excessive and frequent menstruation with irregular cycle: Secondary | ICD-10-CM

## 2015-01-05 LAB — CBC WITH DIFFERENTIAL (CANCER CENTER ONLY)
BASO#: 0 10*3/uL (ref 0.0–0.2)
BASO%: 0.2 % (ref 0.0–2.0)
EOS%: 0.7 % (ref 0.0–7.0)
Eosinophils Absolute: 0 10*3/uL (ref 0.0–0.5)
HCT: 35.6 % (ref 34.8–46.6)
HGB: 11.6 g/dL (ref 11.6–15.9)
LYMPH#: 1.9 10*3/uL (ref 0.9–3.3)
LYMPH%: 35.8 % (ref 14.0–48.0)
MCH: 25.3 pg — ABNORMAL LOW (ref 26.0–34.0)
MCHC: 32.6 g/dL (ref 32.0–36.0)
MCV: 78 fL — AB (ref 81–101)
MONO#: 0.6 10*3/uL (ref 0.1–0.9)
MONO%: 10.1 % (ref 0.0–13.0)
NEUT#: 2.9 10*3/uL (ref 1.5–6.5)
NEUT%: 53.2 % (ref 39.6–80.0)
PLATELETS: 299 10*3/uL (ref 145–400)
RBC: 4.58 10*6/uL (ref 3.70–5.32)
RDW: 18.1 % — ABNORMAL HIGH (ref 11.1–15.7)
WBC: 5.4 10*3/uL (ref 3.9–10.0)

## 2015-01-05 LAB — CHCC SATELLITE - SMEAR

## 2015-01-05 NOTE — Progress Notes (Signed)
  Hematology and Oncology Follow Up Visit  Barbara MomentKathrine Mcdaniel [id number] January 19, 1981 34 y.o. 01/05/2015   Principle Diagnosis:   Iron deficiency anemia  Menometrorrhagia  Current Therapy:    IV iron as indicated     Interim History:  [name] is back for follow-up. We first saw her back in late February. She has menometrorrhagia. She will be having a hysterectomy in May. This will be on May 9.  She feels better. She does not feel as tired. She is still working.  When we first saw her, her ferritin was only 14. Her iron saturation was 7.  When we saw her in March, her ferritin was 632. Her iron saturation is 47%.  Her appetite is good. She's had no nausea or vomiting. She's had no leg swelling. She's had no rashes. There's been an occasional nosebleed. This seems to happen around her cycle.   Overall, her performance status is ECOG 1.   Medications:  Current outpatient prescriptions:  .  clindamycin-benzoyl peroxide (BENZACLIN) gel, Apply topically 2 (two) times daily., Disp: , Rfl:  .  DULoxetine (CYMBALTA) 60 MG capsule, Take 1 capsule (60 mg total) by mouth daily., Disp: 30 capsule, Rfl: 3 .  lisinopril-hydrochlorothiazide (PRINZIDE,ZESTORETIC) 20-25 MG per tablet, Take 1 tablet by mouth daily., Disp: 90 tablet, Rfl: 3 .  tretinoin (RETIN-A) 0.025 % gel, Apply topically at bedtime., Disp: , Rfl:   Allergies: No Known Allergies  Past Medical History, Surgical history, Social history, and Family History were reviewed and updated.  Review of Systems: As above  Physical Exam:  height is 5\' 9"  (1.753 m) and weight is 240 lb (108.863 kg). Her oral temperature is 98.8 F (37.1 C). Her blood pressure is 114/67 and her pulse is 85. Her respiration is 14.   Wt Readings from Last 3 Encounters:  01/05/15 240 lb (108.863 kg)  12/23/14 242 lb (109.77 kg)  12/08/14 240 lb (108.863 kg)     Obese African-American female in no obvious distress. Head and neck exam shows no ocular  or oral lesions. She has no scleral icterus. She has no adenopathy in the neck. Lungs are clear. Cardiac exam regular rate and rhythm with no murmurs, rubs or bruits. Abdomen is soft. She is obese. She has good bowel sounds. There is no fluid wave. Extremities shows no clubbing, cyanosis or edema. Skin exam no rashes, ecchymoses or petechia. Neurological exam is nonfocal.  Lab Results  Component Value Date   WBC 5.4 01/05/2015   HGB 11.6 01/05/2015   HCT 35.6 01/05/2015   MCV 78* 01/05/2015   PLT 299 01/05/2015     Chemistry   No results found for: NA, K, CL, CO2, BUN, CREATININE, GLU No results found for: CALCIUM, ALKPHOS, AST, ALT, BILITOT       Impression and Plan: [name] is 34 year old African-American female with menometrorrhagia and iron deficiency anemia. She will will be having her hysterectomy on May 9.  I suspect that her iron is dropping. We probably will have to give her a dose this week.  I want to get her back in one month. At that point, she will be a couple weeks away from her surgery and we can see about her iron levels.   Josph MachoENNEVER,PETER R, MD 3/28/20161:20 PM

## 2015-01-06 LAB — FERRITIN CHCC: FERRITIN: 333 ng/mL — AB (ref 9–269)

## 2015-01-06 LAB — IRON AND TIBC CHCC
%SAT: 28 % (ref 21–57)
IRON: 68 ug/dL (ref 41–142)
TIBC: 241 ug/dL (ref 236–444)
UIBC: 173 ug/dL (ref 120–384)

## 2015-01-20 ENCOUNTER — Ambulatory Visit: Payer: 59 | Admitting: Sports Medicine

## 2015-02-02 ENCOUNTER — Ambulatory Visit (HOSPITAL_BASED_OUTPATIENT_CLINIC_OR_DEPARTMENT_OTHER): Payer: 59

## 2015-02-02 ENCOUNTER — Other Ambulatory Visit (HOSPITAL_BASED_OUTPATIENT_CLINIC_OR_DEPARTMENT_OTHER): Payer: 59

## 2015-02-02 ENCOUNTER — Ambulatory Visit (HOSPITAL_BASED_OUTPATIENT_CLINIC_OR_DEPARTMENT_OTHER): Payer: 59 | Admitting: Family

## 2015-02-02 VITALS — BP 111/53 | HR 78 | Temp 97.9°F | Resp 18 | Wt 237.0 lb

## 2015-02-02 DIAGNOSIS — N921 Excessive and frequent menstruation with irregular cycle: Secondary | ICD-10-CM

## 2015-02-02 DIAGNOSIS — D500 Iron deficiency anemia secondary to blood loss (chronic): Secondary | ICD-10-CM

## 2015-02-02 DIAGNOSIS — E611 Iron deficiency: Secondary | ICD-10-CM

## 2015-02-02 LAB — RETICULOCYTES (CHCC)
ABS Retic: 50.9 10*3/uL (ref 19.0–186.0)
RBC.: 4.63 MIL/uL (ref 3.87–5.11)
Retic Ct Pct: 1.1 % (ref 0.4–2.3)

## 2015-02-02 LAB — CBC WITH DIFFERENTIAL (CANCER CENTER ONLY)
BASO#: 0 10*3/uL (ref 0.0–0.2)
BASO%: 0.3 % (ref 0.0–2.0)
EOS%: 0.7 % (ref 0.0–7.0)
Eosinophils Absolute: 0 10*3/uL (ref 0.0–0.5)
HEMATOCRIT: 36.6 % (ref 34.8–46.6)
HGB: 12 g/dL (ref 11.6–15.9)
LYMPH#: 1.8 10*3/uL (ref 0.9–3.3)
LYMPH%: 30.3 % (ref 14.0–48.0)
MCH: 25.6 pg — AB (ref 26.0–34.0)
MCHC: 32.8 g/dL (ref 32.0–36.0)
MCV: 78 fL — ABNORMAL LOW (ref 81–101)
MONO#: 0.6 10*3/uL (ref 0.1–0.9)
MONO%: 9.7 % (ref 0.0–13.0)
NEUT#: 3.5 10*3/uL (ref 1.5–6.5)
NEUT%: 59 % (ref 39.6–80.0)
PLATELETS: 322 10*3/uL (ref 145–400)
RBC: 4.68 10*6/uL (ref 3.70–5.32)
RDW: 17.4 % — ABNORMAL HIGH (ref 11.1–15.7)
WBC: 5.9 10*3/uL (ref 3.9–10.0)

## 2015-02-02 LAB — IRON AND TIBC CHCC
%SAT: 16 % — AB (ref 21–57)
IRON: 41 ug/dL (ref 41–142)
TIBC: 253 ug/dL (ref 236–444)
UIBC: 212 ug/dL (ref 120–384)

## 2015-02-02 LAB — FERRITIN CHCC: Ferritin: 222 ng/ml (ref 9–269)

## 2015-02-02 MED ORDER — SODIUM CHLORIDE 0.9 % IV SOLN
510.0000 mg | Freq: Once | INTRAVENOUS | Status: AC
  Administered 2015-02-02: 510 mg via INTRAVENOUS
  Filled 2015-02-02: qty 17

## 2015-02-02 NOTE — Progress Notes (Signed)
Hematology and Oncology Follow Up Visit  Vassie MomentKathrine Mendonsa [id number] 05/27/1981 34 y.o. 02/02/2015   Principle Diagnosis:  Iron deficiency anemia  Menometrorrhagia   Current Therapy:   IV iron as indicated     Interim History:  Ms. Montine CircleChatman is here today for a follow-up. She is still feeling tired and having heavy cycles. Her hysterectomy is scheduled for May 9th. She received 2 doses of Feraheme in February and another in March. Her last iron saturation was 28% and ferritin was 333.  She denies fever, chills, n/v, cough, rash, dizziness, SOB, chest pain, palpitations, abdominal pain, constipation, diarrhea, blood in urine or stool. She has not had any more headaches or nose bleeds.  She is still chewing a lot of ice.  Her appetite is good and she is staying hydrated. Her weight is stable.   Medications:    Medication List       This list is accurate as of: 02/02/15 10:23 AM.  Always use your most recent med list.               clindamycin-benzoyl peroxide gel  Commonly known as:  BENZACLIN  Apply topically 2 (two) times daily.     DULoxetine 60 MG capsule  Commonly known as:  CYMBALTA  Take 1 capsule (60 mg total) by mouth daily.     lisinopril-hydrochlorothiazide 20-25 MG per tablet  Commonly known as:  PRINZIDE,ZESTORETIC  Take 1 tablet by mouth daily.     tretinoin 0.025 % gel  Commonly known as:  RETIN-A  Apply topically at bedtime.        Allergies: No Known Allergies  Past Medical History, Surgical history, Social history, and Family History were reviewed and updated.  Review of Systems: All other 10 point review of systems is negative.   Physical Exam:  vitals were not taken for this visit.  Wt Readings from Last 3 Encounters:  01/05/15 240 lb (108.863 kg)  12/23/14 242 lb (109.77 kg)  12/08/14 240 lb (108.863 kg)    Ocular: Sclerae unicteric, pupils equal, round and reactive to light Ear-nose-throat: Oropharynx clear, dentition fair Lymphatic:  No cervical or supraclavicular adenopathy Lungs no rales or rhonchi, good excursion bilaterally Heart regular rate and rhythm, no murmur appreciated Abd soft, nontender, positive bowel sounds MSK no focal spinal tenderness, no joint edema Neuro: non-focal, well-oriented, appropriate affect Breasts: Deferred  Lab Results  Component Value Date   WBC 5.4 01/05/2015   HGB 11.6 01/05/2015   HCT 35.6 01/05/2015   MCV 78* 01/05/2015   PLT 299 01/05/2015   Lab Results  Component Value Date   FERRITIN 333* 01/05/2015   IRON 68 01/05/2015   TIBC 241 01/05/2015   UIBC 173 01/05/2015   IRONPCTSAT 28 01/05/2015   Lab Results  Component Value Date   RETICCTPCT 1.0 12/08/2014   RBC 4.58 01/05/2015   RETICCTABS 47.0 12/08/2014   No results found for: KPAFRELGTCHN, LAMBDASER, KAPLAMBRATIO No results found for: IGGSERUM, IGA, IGMSERUM No results found for: TOTALPROTELP, ALBUMINELP, A1GS, A2GS, BETS, BETA2SER, GAMS, MSPIKE, SPEI   Chemistry   No results found for: NA, K, CL, CO2, BUN, CREATININE, GLU No results found for: CALCIUM, ALKPHOS, AST, ALT, BILITOT   Impression and Plan: Ms. Montine CircleChatman is 34 year old African-American female with menometrorrhagia and iron deficiency anemia. She is feeling fatigued and still having heavy cycles.  Her hysterectomy is scheduled for May 9th.  We will give her a dose of Feraheme today to help build up her stores before surgery.  Her Hgb today is 12.0 and MCV is 78. We will see what her iron studies show.  We will see her back in 6 weeks for labs and follow-up.  She knows to call here with any questions or concerns and to go to the ED in the event of an emergency. We can certainly see her sooner if need be.   Verdie Mosher, NP 4/25/201610:23 AM

## 2015-02-02 NOTE — Patient Instructions (Signed)

## 2015-02-06 ENCOUNTER — Encounter: Payer: Self-pay | Admitting: Sports Medicine

## 2015-02-06 ENCOUNTER — Ambulatory Visit (INDEPENDENT_AMBULATORY_CARE_PROVIDER_SITE_OTHER): Payer: 59 | Admitting: Sports Medicine

## 2015-02-06 VITALS — BP 121/78 | HR 93 | Ht 69.0 in | Wt 237.0 lb

## 2015-02-06 DIAGNOSIS — E611 Iron deficiency: Secondary | ICD-10-CM | POA: Diagnosis not present

## 2015-02-06 DIAGNOSIS — E669 Obesity, unspecified: Secondary | ICD-10-CM

## 2015-02-06 DIAGNOSIS — M791 Myalgia: Secondary | ICD-10-CM

## 2015-02-06 DIAGNOSIS — I10 Essential (primary) hypertension: Secondary | ICD-10-CM

## 2015-02-06 DIAGNOSIS — M7918 Myalgia, other site: Secondary | ICD-10-CM

## 2015-02-06 MED ORDER — LISINOPRIL-HYDROCHLOROTHIAZIDE 20-25 MG PO TABS: 1.0000 | ORAL_TABLET | Freq: Every day | ORAL | Status: DC

## 2015-02-06 MED ORDER — DULOXETINE HCL 60 MG PO CPEP: 60.0000 mg | ORAL_CAPSULE | Freq: Every day | ORAL | Status: AC

## 2015-02-06 NOTE — Assessment & Plan Note (Signed)
Patient is finally no longer anemic however she is still microcytic. Continue iron infusions, she will likely need another couple infusions after her hysterectomy.

## 2015-02-06 NOTE — Assessment & Plan Note (Signed)
At this point she is completely pain-free on 60 of Cymbalta. Very happy with results. Refilling medication with a year's supply.

## 2015-02-06 NOTE — Assessment & Plan Note (Signed)
Well-controlled, refilling medication three-month supply.

## 2015-02-06 NOTE — Progress Notes (Signed)
  Subjective:    CC: Follow-up  HPI: Myofascial pain syndrome: Pain is now completely resolved at Cymbalta 60.  Iron deficiency anemia: Anemia has now resolved, still with mild iron deficiency and microcytosis. She does have menometrorrhagia, and has a hysterectomy scheduled for next month.  Obesity: Understands need to wait until her hysterectomy before proceeding with weight loss treatment.  Past medical history, Surgical history, Family history not pertinant except as noted below, Social history, Allergies, and medications have been entered into the medical record, reviewed, and no changes needed.   Review of Systems: No fevers, chills, night sweats, weight loss, chest pain, or shortness of breath.   Objective:    General: Well Developed, well nourished, and in no acute distress.  Neuro: Alert and oriented x3, extra-ocular muscles intact, sensation grossly intact.  HEENT: Normocephalic, atraumatic, pupils equal round reactive to light, neck supple, no masses, no lymphadenopathy, thyroid nonpalpable.  Skin: Warm and dry, no rashes. Cardiac: Regular rate and rhythm, no murmurs rubs or gallops, no lower extremity edema.  Respiratory: Clear to auscultation bilaterally. Not using accessory muscles, speaking in full sentences.  Impression and Recommendations:

## 2015-02-06 NOTE — Assessment & Plan Note (Signed)
We can restart phentermine after hysterectomy. We would also like to get her into a resistance training program.

## 2015-03-16 ENCOUNTER — Ambulatory Visit: Payer: 59

## 2015-03-16 ENCOUNTER — Ambulatory Visit (HOSPITAL_BASED_OUTPATIENT_CLINIC_OR_DEPARTMENT_OTHER): Payer: 59 | Admitting: Hematology & Oncology

## 2015-03-16 ENCOUNTER — Other Ambulatory Visit (HOSPITAL_BASED_OUTPATIENT_CLINIC_OR_DEPARTMENT_OTHER): Payer: 59

## 2015-03-16 ENCOUNTER — Encounter: Payer: Self-pay | Admitting: Hematology & Oncology

## 2015-03-16 VITALS — BP 123/74 | HR 91 | Temp 98.8°F | Resp 18 | Ht 69.0 in | Wt 234.0 lb

## 2015-03-16 DIAGNOSIS — D509 Iron deficiency anemia, unspecified: Secondary | ICD-10-CM | POA: Diagnosis not present

## 2015-03-16 DIAGNOSIS — E611 Iron deficiency: Secondary | ICD-10-CM

## 2015-03-16 DIAGNOSIS — N921 Excessive and frequent menstruation with irregular cycle: Secondary | ICD-10-CM | POA: Diagnosis not present

## 2015-03-16 HISTORY — DX: Iron deficiency anemia, unspecified: D50.9

## 2015-03-16 HISTORY — DX: Excessive and frequent menstruation with irregular cycle: N92.1

## 2015-03-16 LAB — CBC WITH DIFFERENTIAL (CANCER CENTER ONLY)
BASO#: 0 10*3/uL (ref 0.0–0.2)
BASO%: 0.2 % (ref 0.0–2.0)
EOS%: 0.3 % (ref 0.0–7.0)
Eosinophils Absolute: 0 10*3/uL (ref 0.0–0.5)
HEMATOCRIT: 38.4 % (ref 34.8–46.6)
HEMOGLOBIN: 12.8 g/dL (ref 11.6–15.9)
LYMPH#: 2.2 10*3/uL (ref 0.9–3.3)
LYMPH%: 33 % (ref 14.0–48.0)
MCH: 26.8 pg (ref 26.0–34.0)
MCHC: 33.3 g/dL (ref 32.0–36.0)
MCV: 80 fL — ABNORMAL LOW (ref 81–101)
MONO#: 0.6 10*3/uL (ref 0.1–0.9)
MONO%: 8.5 % (ref 0.0–13.0)
NEUT#: 3.8 10*3/uL (ref 1.5–6.5)
NEUT%: 58 % (ref 39.6–80.0)
PLATELETS: 343 10*3/uL (ref 145–400)
RBC: 4.78 10*6/uL (ref 3.70–5.32)
RDW: 15.6 % (ref 11.1–15.7)
WBC: 6.6 10*3/uL (ref 3.9–10.0)

## 2015-03-16 LAB — IRON AND TIBC CHCC
%SAT: 33 % (ref 21–57)
IRON: 77 ug/dL (ref 41–142)
TIBC: 234 ug/dL — ABNORMAL LOW (ref 236–444)
UIBC: 156 ug/dL (ref 120–384)

## 2015-03-16 LAB — RETICULOCYTES (CHCC)
ABS RETIC: 77.3 10*3/uL (ref 19.0–186.0)
RBC.: 4.83 MIL/uL (ref 3.87–5.11)
Retic Ct Pct: 1.6 % (ref 0.4–2.3)

## 2015-03-16 LAB — FERRITIN CHCC: FERRITIN: 359 ng/mL — AB (ref 9–269)

## 2015-03-16 NOTE — Progress Notes (Signed)
  Hematology and Oncology Follow Up Visit  Barbara MomentKathrine Mcdaniel [id number] [birth year] 34 y.o. 03/16/2015   Principle Diagnosis:   Iron deficiency anemia  Menometrorrhagia  Current Therapy:    IV iron as indicated     Interim History:  Barbara Mcdaniel is back for follow-up. She really looks good. She had her hysterectomy done about a month or so ago. She had laparoscopically. This really will help her more than anything.  She feels okay. Her hemoglobin has responded well to iron. Patient is not chewing ice. She has no sore mouth. She's had no cough. His been no change in bowel or bladder habits.   Overall, her performance status is ECOG 1.   Medications:  Current outpatient prescriptions:  .  clindamycin-benzoyl peroxide (BENZACLIN) gel, Apply topically 2 (two) times daily., Disp: , Rfl:  .  DULoxetine (CYMBALTA) 60 MG capsule, Take 1 capsule (60 mg total) by mouth daily., Disp: 90 capsule, Rfl: 3 .  lisinopril-hydrochlorothiazide (PRINZIDE,ZESTORETIC) 20-25 MG per tablet, Take 1 tablet by mouth daily., Disp: 90 tablet, Rfl: 3 .  methocarbamol (ROBAXIN) 500 MG tablet, , Disp: , Rfl:  .  tretinoin (RETIN-A) 0.025 % gel, Apply topically at bedtime., Disp: , Rfl:   Allergies: No Known Allergies  Past Medical History, Surgical history, Social history, and Family History were reviewed and updated.  Review of Systems: As above  Physical Exam:  height is 5\' 9"  (1.753 m) and weight is 234 lb (106.142 kg). Her oral temperature is 98.8 F (37.1 C). Her blood pressure is 123/74 and her pulse is 91. Her respiration is 18.   Wt Readings from Last 3 Encounters:  03/16/15 234 lb (106.142 kg)  02/06/15 237 lb (107.502 kg)  02/02/15 237 lb (107.502 kg)     Obese African-American female in no obvious distress. Head and neck exam shows no ocular or oral lesions. She has no scleral icterus. She has no adenopathy in the neck. Lungs are clear. Cardiac exam regular rate and rhythm with no murmurs,  rubs or bruits. Abdomen is soft. She is obese. She has good bowel sounds. There is no fluid wave. Extremities shows no clubbing, cyanosis or edema. Skin exam no rashes, ecchymoses or petechia. Neurological exam is nonfocal.  Lab Results  Component Value Date   WBC 6.6 03/16/2015   HGB 12.8 03/16/2015   HCT 38.4 03/16/2015   MCV 80* 03/16/2015   PLT 343 03/16/2015     Chemistry   No results found for: NA, K, CL, CO2, BUN, CREATININE, GLU No results found for: CALCIUM, ALKPHOS, AST, ALT, BILITOT       Impression and Plan: Barbara Mcdaniel is 34 year old African-American female with menometrorrhagia and iron deficiency anemia.   She has a hysterectomy about a month ago. She did well with this. I think that we'll take care of her iron deficiency.  I think we'll probably get her back in 3 months now. We can probably get into the summertime. see about her iron levels.   Josph MachoENNEVER,PETER R, MD 6/6/20162:49 PM

## 2015-03-25 ENCOUNTER — Encounter: Payer: Self-pay | Admitting: Sports Medicine

## 2015-03-25 ENCOUNTER — Ambulatory Visit (INDEPENDENT_AMBULATORY_CARE_PROVIDER_SITE_OTHER): Payer: 59 | Admitting: Sports Medicine

## 2015-03-25 VITALS — BP 106/75 | HR 81 | Wt 236.0 lb

## 2015-03-25 DIAGNOSIS — Z90710 Acquired absence of both cervix and uterus: Secondary | ICD-10-CM

## 2015-03-25 DIAGNOSIS — E669 Obesity, unspecified: Secondary | ICD-10-CM

## 2015-03-25 MED ORDER — PHENTERMINE HCL 37.5 MG PO TABS: ORAL_TABLET | ORAL | Status: DC

## 2015-03-25 NOTE — Assessment & Plan Note (Signed)
Postop now, starting phentermine. Return monthly for weight checks and refills, will probably end Saxenda at some point.

## 2015-03-25 NOTE — Progress Notes (Signed)
  Subjective:    CC: Follow-up  HPI: Iron deficiency anemia: Now resolved post hysterectomy.  Obesity: Amenable to start weight loss medication now.  Hypertension: Well controlled.  Past medical history, Surgical history, Family history not pertinant except as noted below, Social history, Allergies, and medications have been entered into the medical record, reviewed, and no changes needed.   Review of Systems: No fevers, chills, night sweats, weight loss, chest pain, or shortness of breath.   Objective:    General: Well Developed, well nourished, and in no acute distress.  Neuro: Alert and oriented x3, extra-ocular muscles intact, sensation grossly intact.  HEENT: Normocephalic, atraumatic, pupils equal round reactive to light, neck supple, no masses, no lymphadenopathy, thyroid nonpalpable.  Skin: Warm and dry, no rashes. Cardiac: Regular rate and rhythm, no murmurs rubs or gallops, no lower extremity edema.  Respiratory: Clear to auscultation bilaterally. Not using accessory muscles, speaking in full sentences.  Impression and Recommendations:

## 2015-03-25 NOTE — Assessment & Plan Note (Signed)
Iron deficiency anemia has resolved, no more iron infusions needed.Doing extremely well,

## 2015-04-08 ENCOUNTER — Ambulatory Visit: Payer: 59 | Admitting: Sports Medicine

## 2015-04-23 ENCOUNTER — Ambulatory Visit: Payer: 59 | Admitting: Sports Medicine

## 2015-04-28 ENCOUNTER — Ambulatory Visit (INDEPENDENT_AMBULATORY_CARE_PROVIDER_SITE_OTHER): Payer: 59 | Admitting: Sports Medicine

## 2015-04-28 ENCOUNTER — Encounter: Payer: Self-pay | Admitting: Sports Medicine

## 2015-04-28 VITALS — BP 124/88 | HR 88 | Ht 69.0 in | Wt 229.0 lb

## 2015-04-28 DIAGNOSIS — B354 Tinea corporis: Secondary | ICD-10-CM

## 2015-04-28 DIAGNOSIS — E669 Obesity, unspecified: Secondary | ICD-10-CM | POA: Diagnosis not present

## 2015-04-28 DIAGNOSIS — N951 Menopausal and female climacteric states: Secondary | ICD-10-CM

## 2015-04-28 DIAGNOSIS — R232 Flushing: Secondary | ICD-10-CM | POA: Insufficient documentation

## 2015-04-28 LAB — TSH: TSH: 1.171 u[IU]/mL (ref 0.350–4.500)

## 2015-04-28 MED ORDER — CLOTRIMAZOLE-BETAMETHASONE 1-0.05 % EX CREA: 1.0000 "application " | TOPICAL_CREAM | Freq: Two times a day (BID) | CUTANEOUS | Status: DC

## 2015-04-28 MED ORDER — PHENTERMINE HCL 37.5 MG PO TABS: ORAL_TABLET | ORAL | Status: DC

## 2015-04-28 NOTE — Progress Notes (Signed)
  Subjective:    CC: weight check  HPI: Obesity: Good weight loss after the first month on phentermine.  Hot flashes: She is post-hysterectomy but still has her ovaries, currently in the secretory phase, gets random episodes of these motor instability with hot flashes and sweating. She tells me that this is not linked with current 94 humid days.symptoms are moderate, persistent, no chills.  Skin rash: Circular, scaly on the buttock, she has a few more of these on her arms, she does have a pet dog that she tends to cuddle with. Rash is minimally pruritic  Past medical history, Surgical history, Family history not pertinant except as noted below, Social history, Allergies, and medications have been entered into the medical record, reviewed, and no changes needed.   Review of Systems: No fevers, chills, night sweats, weight loss, chest pain, or shortness of breath.   Objective:    General: Well Developed, well nourished, and in no acute distress.  Neuro: Alert and oriented x3, extra-ocular muscles intact, sensation grossly intact.  HEENT: Normocephalic, atraumatic, pupils equal round reactive to light, neck supple, no masses, no lymphadenopathy, thyroid nonpalpable.  Skin: Warm and dry, no rashes.  patient showed me a picture of the rash on her buttock from her cell phone, it was a circular scaly rash with a scaling leading edge consistent with tinea corporis.she had a couple more of these on her forearm Cardiac: Regular rate and rhythm, no murmurs rubs or gallops, no lower extremity edema.  Respiratory: Clear to auscultation bilaterally. Not using accessory muscles, speaking in full sentences.  Impression and Recommendations:

## 2015-04-28 NOTE — Assessment & Plan Note (Signed)
Lotrisone 

## 2015-04-28 NOTE — Assessment & Plan Note (Signed)
Post hysterectomy but still has ovaries, currently in mid secretory phase, checking fertility panel.

## 2015-04-28 NOTE — Assessment & Plan Note (Signed)
Good weight loss, refilling phentermine as we into the second month

## 2015-04-28 NOTE — Patient Instructions (Signed)

## 2015-04-29 LAB — PROLACTIN: Prolactin: 6.7 ng/mL

## 2015-04-29 LAB — PROGESTERONE: Progesterone: 0.5 ng/mL

## 2015-04-29 LAB — FOLLICLE STIMULATING HORMONE: FSH: 7.4 m[IU]/mL

## 2015-04-29 LAB — LUTEINIZING HORMONE: LH: 5.2 m[IU]/mL

## 2015-04-30 LAB — ESTROGENS, TOTAL: Estrogen: 215 pg/mL

## 2015-05-27 ENCOUNTER — Ambulatory Visit: Payer: 59 | Admitting: Sports Medicine

## 2015-06-05 ENCOUNTER — Ambulatory Visit (INDEPENDENT_AMBULATORY_CARE_PROVIDER_SITE_OTHER): Payer: 59 | Admitting: Sports Medicine

## 2015-06-05 ENCOUNTER — Encounter: Payer: Self-pay | Admitting: Sports Medicine

## 2015-06-05 VITALS — BP 109/80 | HR 113 | Ht 69.0 in | Wt 224.0 lb

## 2015-06-05 DIAGNOSIS — E669 Obesity, unspecified: Secondary | ICD-10-CM

## 2015-06-05 DIAGNOSIS — N951 Menopausal and female climacteric states: Secondary | ICD-10-CM | POA: Diagnosis not present

## 2015-06-05 DIAGNOSIS — R232 Flushing: Secondary | ICD-10-CM

## 2015-06-05 MED ORDER — LIRAGLUTIDE -WEIGHT MANAGEMENT 18 MG/3ML ~~LOC~~ SOPN: 3.0000 mg | PEN_INJECTOR | Freq: Every day | SUBCUTANEOUS | Status: DC

## 2015-06-05 MED ORDER — TOPIRAMATE 50 MG PO TABS: ORAL_TABLET | ORAL | Status: DC

## 2015-06-05 MED ORDER — PHENTERMINE HCL 37.5 MG PO TABS: ORAL_TABLET | ORAL | Status: DC

## 2015-06-05 NOTE — Assessment & Plan Note (Signed)
This is nonpathologic and patient is not perimenopausal. We will simply keep an eye on this for now.

## 2015-06-05 NOTE — Progress Notes (Signed)
  Subjective:    CC: follow-up  HPI: Obesity: Good continued weight loss as we enter the third month, she is amenable to try both Saxenda and Topamax as well to accelerate her weight loss.  Past medical history, Surgical history, Family history not pertinant except as noted below, Social history, Allergies, and medications have been entered into the medical record, reviewed, and no changes needed.   Review of Systems: No fevers, chills, night sweats, weight loss, chest pain, or shortness of breath.   Objective:    General: Well Developed, well nourished, and in no acute distress.  Neuro: Alert and oriented x3, extra-ocular muscles intact, sensation grossly intact.  HEENT: Normocephalic, atraumatic, pupils equal round reactive to light, neck supple, no masses, no lymphadenopathy, thyroid nonpalpable.  Skin: Warm and dry, no rashes. Cardiac: Regular rate and rhythm, no murmurs rubs or gallops, no lower extremity edema.  Respiratory: Clear to auscultation bilaterally. Not using accessory muscles, speaking in full sentences.  Impression and Recommendations:   I spent 25 minutes with this patient, greater than 50% was face-to-face time counseling regarding the above diagnoses, part of this time was discussing the mechanism of action of GLP-1 agonist.

## 2015-06-05 NOTE — Assessment & Plan Note (Signed)
Good continued weight loss however slight plateau on phentermine as we entered the third month. Refilling medication and adding Topamax and Saxenda. Her husband is an [employer] fund[employer], and will be able to help her with injections.

## 2015-06-10 ENCOUNTER — Telehealth: Payer: Self-pay | Admitting: Sports Medicine

## 2015-06-10 NOTE — Telephone Encounter (Signed)
Filled out printed form form insurance, placed in PCP's box for signature then will fax.

## 2015-06-10 NOTE — Telephone Encounter (Signed)
Information submitted via CoverMyMeds and sent to plan. Insurance will contact out office with questions regarding Rx.

## 2015-06-17 ENCOUNTER — Other Ambulatory Visit: Payer: 59

## 2015-06-17 ENCOUNTER — Ambulatory Visit: Payer: 59 | Admitting: Family

## 2015-06-25 ENCOUNTER — Ambulatory Visit: Payer: 59 | Admitting: Family

## 2015-06-25 ENCOUNTER — Ambulatory Visit: Payer: 59

## 2015-07-02 ENCOUNTER — Ambulatory Visit: Payer: 59

## 2015-07-02 ENCOUNTER — Ambulatory Visit (HOSPITAL_BASED_OUTPATIENT_CLINIC_OR_DEPARTMENT_OTHER): Payer: 59 | Admitting: Family

## 2015-07-02 VITALS — BP 128/76 | HR 109 | Temp 98.6°F | Resp 18 | Ht 69.0 in | Wt 223.0 lb

## 2015-07-02 DIAGNOSIS — D509 Iron deficiency anemia, unspecified: Secondary | ICD-10-CM | POA: Diagnosis not present

## 2015-07-02 NOTE — Progress Notes (Signed)
Hematology and Oncology Follow Up Visit  Barbara Mcdaniel [id number] [birth year] 34 y.o. 07/02/2015   Principle Diagnosis:  Iron deficiency anemia  Menometrorrhagia   Current Therapy:   IV iron as indicated     Interim History:  Barbara Mcdaniel is here today for a follow-up. She is feeling better. She had her hysterectomy 4 months ago and iron studies have responded nicely. His iron saturation at this time is 33% with a ferritin of 389.  She still has some mild fatigue and hot flashes at times since her surgery. Her ice cravings have improved.  She denies fever, chills, n/v, cough, dizziness, SOB, chest pain, palpitations, abdominal pain, changes in bowel or bladder habits. She has not noticed any blood in her urine or stool.  She denies having any aches or pains.  She is eating well and staying hydrated. Her weight is stable.   Medications:    Medication List       This list is accurate as of: 07/02/15  2:55 PM.  Always use your most recent med list.               DULoxetine 60 MG capsule  Commonly known as:  CYMBALTA  Take 1 capsule (60 mg total) by mouth daily.     lisinopril-hydrochlorothiazide 20-25 MG per tablet  Commonly known as:  PRINZIDE,ZESTORETIC  Take 1 tablet by mouth daily.     methocarbamol 500 MG tablet  Commonly known as:  ROBAXIN     phentermine 37.5 MG tablet  Commonly known as:  ADIPEX-P  One tab by mouth qAM        Allergies: No Known Allergies  Past Medical History, Surgical history, Social history, and Family History were reviewed and updated.  Review of Systems: All other 10 point review of systems is negative.   Physical Exam:  height is  (1.753 m) and weight is 223 lb (101.152 kg). Her oral temperature is 98.6 F (37 C). Her blood pressure is 128/76 and her pulse is 109. Her respiration is 18.   Wt Readings from Last 3 Encounters:  07/02/15 223 lb (101.152 kg)  06/05/15 224 lb (101.606 kg)  04/28/15 229 lb (103.874 kg)     Ocular: Sclerae unicteric, pupils equal, round and reactive to light Ear-nose-throat: Oropharynx clear, dentition fair Lymphatic: No cervical or supraclavicular adenopathy Lungs no rales or rhonchi, good excursion bilaterally Heart regular rate and rhythm, no murmur appreciated Abd soft, nontender, positive bowel sounds MSK no focal spinal tenderness, no joint edema Neuro: non-focal, well-oriented, appropriate affect Breasts: Deferred  Lab Results  Component Value Date   WBC 6.6 03/16/2015   HGB 12.8 03/16/2015   HCT 38.4 03/16/2015   MCV 80* 03/16/2015   PLT 343 03/16/2015   Lab Results  Component Value Date   FERRITIN 359* 03/16/2015   IRON 77 03/16/2015   TIBC 234* 03/16/2015   UIBC 156 03/16/2015   IRONPCTSAT 33 03/16/2015   Lab Results  Component Value Date   RETICCTPCT 1.6 03/16/2015   RBC 4.83 03/16/2015   RETICCTABS 77.3 03/16/2015   No results found for: KPAFRELGTCHN, LAMBDASER, KAPLAMBRATIO No results found for: IGGSERUM, IGA, IGMSERUM No results found for: TOTALPROTELP, ALBUMINELP, A1GS, A2GS, BETS, BETA2SER, GAMS, MSPIKE, SPEI   Chemistry   No results found for: NA, K, CL, CO2, BUN, CREATININE, GLU No results found for: CALCIUM, ALKPHOS, AST, ALT, BILITOT   Impression and Plan: Barbara Mcdaniel is 34 year old African-American female with iron deficiency anemia. Now that she has  had a hysterectomy this seems to be resolving. Her symptoms have improved significantly.  Her iron studies have come up nicely with a saturation of 33% and ferritin of 389. No anemia.   We will plan to see her back in 3 months for labs and follow-up.  She knows to call here with any questions or concerns. We can certainly see her sooner if need be.   Verdie Mosher, NP 9/22/20162:55 PM

## 2015-07-03 ENCOUNTER — Ambulatory Visit: Payer: 59 | Admitting: Sports Medicine

## 2015-07-09 ENCOUNTER — Ambulatory Visit (INDEPENDENT_AMBULATORY_CARE_PROVIDER_SITE_OTHER): Payer: 59 | Admitting: Sports Medicine

## 2015-07-09 ENCOUNTER — Encounter: Payer: Self-pay | Admitting: Sports Medicine

## 2015-07-09 VITALS — BP 131/98 | HR 113 | Ht 69.0 in | Wt 225.0 lb

## 2015-07-09 DIAGNOSIS — E669 Obesity, unspecified: Secondary | ICD-10-CM

## 2015-07-09 MED ORDER — PHENTERMINE HCL 37.5 MG PO TABS: ORAL_TABLET | ORAL | Status: DC

## 2015-07-09 NOTE — Progress Notes (Signed)
  Subjective:    CC: Follow-up  HPI: Obesity: Really has not lost any weight, has had great difficult to tolerating Topamax, and has not yet obtained her Saxenda injections.  She also has some complaints regarding insomnia, she tends to wake up at 3 AM, and she never feels well rested in the morning, on further questioning she does tend to stare at a bright screen into the night.  Past medical history, Surgical history, Family history not pertinant except as noted below, Social history, Allergies, and medications have been entered into the medical record, reviewed, and no changes needed.   Review of Systems: No fevers, chills, night sweats, weight loss, chest pain, or shortness of breath.   Objective:    General: Well Developed, well nourished, and in no acute distress.  Neuro: Alert and oriented x3, extra-ocular muscles intact, sensation grossly intact.  HEENT: Normocephalic, atraumatic, pupils equal round reactive to light, neck supple, no masses, no lymphadenopathy, thyroid nonpalpable.  Skin: Warm and dry, no rashes. Cardiac: Regular rate and rhythm, no murmurs rubs or gallops, no lower extremity edema.  Respiratory: Clear to auscultation bilaterally. Not using accessory muscles, speaking in full sentences.  Impression and Recommendations:

## 2015-07-09 NOTE — Assessment & Plan Note (Signed)
Discontinue Topamax due to intolerance.  Refilling phentermine, we are entering the month. Has not yet started Saxenda. Return in one month. 20 pound weight loss total.

## 2015-08-06 ENCOUNTER — Ambulatory Visit: Payer: 59 | Admitting: Sports Medicine

## 2015-08-13 ENCOUNTER — Ambulatory Visit: Payer: 59 | Admitting: Sports Medicine

## 2015-10-01 ENCOUNTER — Other Ambulatory Visit: Payer: 59

## 2015-10-01 ENCOUNTER — Ambulatory Visit (HOSPITAL_BASED_OUTPATIENT_CLINIC_OR_DEPARTMENT_OTHER): Payer: 59 | Admitting: Family

## 2015-10-01 ENCOUNTER — Encounter: Payer: Self-pay | Admitting: Family

## 2015-10-01 ENCOUNTER — Encounter: Payer: Self-pay | Admitting: Nurse Practitioner

## 2015-10-01 ENCOUNTER — Ambulatory Visit: Payer: 59

## 2015-10-01 VITALS — BP 120/85 | HR 68 | Temp 98.8°F | Resp 16 | Ht 69.0 in | Wt 235.0 lb

## 2015-10-01 DIAGNOSIS — D509 Iron deficiency anemia, unspecified: Secondary | ICD-10-CM | POA: Insufficient documentation

## 2015-10-01 NOTE — Progress Notes (Signed)
Hematology and Oncology Follow Up Visit  Vassie MomentKathrine Fildes [id number] 1980-10-29 34 y.o. 10/01/2015   Principle Diagnosis:  Iron deficiency anemia  Menometrorrhagia   Current Therapy:   IV iron as indicated     Interim History:  Ms. Montine CircleChatman is here today for a follow-up. She is feeling fatigued and having chills. Her iron saturation at this time is 16% with a ferritin of 248. Her hgb is stable at 12.6.  She had a hysterectomy in May and has had no episodes of bleeding or bruising.   fever, n/v, cough, dizziness, SOB, chest pain, palpitations, abdominal pain or changes in bowel or bladder habits.  She has had some joint aches and pains and is now taking Cymbalta daily. No swelling, numbness or tingling in her extremities.  She has a good appetite but admits that she does not eat healthy all the time. She is staying well hydrated. Her weight is up 10 lbs since her last visit.  She is drinking a protein shake in the morning and using her stationary bike 30 minutes a day for exercise.   Medications:    Medication List       This list is accurate as of: 10/01/15 12:29 PM.  Always use your most recent med list.               DULoxetine 60 MG capsule  Commonly known as:  CYMBALTA  Take 1 capsule (60 mg total) by mouth daily.     lisinopril-hydrochlorothiazide 20-25 MG tablet  Commonly known as:  PRINZIDE,ZESTORETIC  Take 1 tablet by mouth daily.     methocarbamol 500 MG tablet  Commonly known as:  ROBAXIN     phentermine 37.5 MG tablet  Commonly known as:  ADIPEX-P  One tab by mouth qAM        Allergies: No Known Allergies  Past Medical History, Surgical history, Social history, and Family History were reviewed and updated.  Review of Systems: All other 10 point review of systems is negative.   Physical Exam:  height is 5\' 9"  (1.753 m) and weight is 235 lb (106.595 kg). Her oral temperature is 98.8 F (37.1 C). Her blood pressure is 120/85 and her pulse is 68. Her  respiration is 16.   Wt Readings from Last 3 Encounters:  10/01/15 235 lb (106.595 kg)  07/09/15 225 lb (102.059 kg)  07/02/15 223 lb (101.152 kg)    Ocular: Sclerae unicteric, pupils equal, round and reactive to light Ear-nose-throat: Oropharynx clear, dentition fair Lymphatic: No cervical supraclavicular or axillary adenopathy Lungs no rales or rhonchi, good excursion bilaterally Heart regular rate and rhythm, no murmur appreciated Abd soft, nontender, positive bowel sounds, no liver or spleen tip palpated on exam MSK no focal spinal tenderness, no joint edema Neuro: non-focal, well-oriented, appropriate affect Breasts: Deferred  Lab Results  Component Value Date   WBC 6.6 03/16/2015   HGB 12.8 03/16/2015   HCT 38.4 03/16/2015   MCV 80* 03/16/2015   PLT 343 03/16/2015   Lab Results  Component Value Date   FERRITIN 359* 03/16/2015   IRON 77 03/16/2015   TIBC 234* 03/16/2015   UIBC 156 03/16/2015   IRONPCTSAT 33 03/16/2015   Lab Results  Component Value Date   RETICCTPCT 1.6 03/16/2015   RBC 4.83 03/16/2015   RETICCTABS 77.3 03/16/2015   No results found for: KPAFRELGTCHN, LAMBDASER, KAPLAMBRATIO No results found for: IGGSERUM, IGA, IGMSERUM No results found for: TOTALPROTELP, ALBUMINELP, A1GS, A2GS, BETS, BETA2SER, GAMS, MSPIKE, SPEI  Chemistry   No results found for: NA, K, CL, CO2, BUN, CREATININE, GLU No results found for: CALCIUM, ALKPHOS, AST, ALT, BILITOT   Impression and Plan: Ms. Fleeger is 34 yo African-American female with iron deficiency anemia. She is symptomatic at this time with fatigue and chills.  Her iron saturation is 16% with a ferritin of 248.  Due to her insurance and financial issues she does not want Feraheme at this time. She would prefer to take an oral iron supplement over the counter. We will have her take 500 mg BID.  She will also start taking a probiotic daily to help prevent constipation.  We will plan to see her back in 6 weeks for  repeat labs and follow-up.  She will contact us with any questions or concerns. We can certainly see her sooner if need be.   Verdie Mosher, NP 12/22/201612:29 PM

## 2015-10-12 ENCOUNTER — Encounter: Payer: Self-pay | Admitting: Hematology & Oncology

## 2015-11-03 ENCOUNTER — Other Ambulatory Visit: Payer: Self-pay | Admitting: Sports Medicine

## 2015-11-12 ENCOUNTER — Ambulatory Visit: Payer: 59

## 2015-11-12 ENCOUNTER — Ambulatory Visit: Payer: 59 | Admitting: Family

## 2015-11-18 ENCOUNTER — Other Ambulatory Visit: Payer: 59

## 2015-11-18 ENCOUNTER — Ambulatory Visit: Payer: 59

## 2015-11-18 ENCOUNTER — Ambulatory Visit: Payer: 59 | Admitting: Family

## 2016-10-06 DIAGNOSIS — H5213 Myopia, bilateral: Secondary | ICD-10-CM | POA: Diagnosis not present

## 2016-10-06 DIAGNOSIS — Z0100 Encounter for examination of eyes and vision without abnormal findings: Secondary | ICD-10-CM | POA: Diagnosis not present

## 2016-10-07 DIAGNOSIS — G4709 Other insomnia: Secondary | ICD-10-CM | POA: Diagnosis not present

## 2016-10-07 DIAGNOSIS — R5382 Chronic fatigue, unspecified: Secondary | ICD-10-CM | POA: Diagnosis not present

## 2016-10-07 DIAGNOSIS — M791 Myalgia: Secondary | ICD-10-CM | POA: Diagnosis not present

## 2016-10-07 DIAGNOSIS — E559 Vitamin D deficiency, unspecified: Secondary | ICD-10-CM | POA: Diagnosis not present

## 2016-10-26 ENCOUNTER — Encounter: Payer: Self-pay | Admitting: Sports Medicine

## 2016-11-01 DIAGNOSIS — R5382 Chronic fatigue, unspecified: Secondary | ICD-10-CM | POA: Diagnosis not present

## 2016-11-01 DIAGNOSIS — M791 Myalgia: Secondary | ICD-10-CM | POA: Diagnosis not present

## 2017-01-30 DIAGNOSIS — E559 Vitamin D deficiency, unspecified: Secondary | ICD-10-CM | POA: Diagnosis not present

## 2017-01-30 DIAGNOSIS — Z6837 Body mass index (BMI) 37.0-37.9, adult: Secondary | ICD-10-CM | POA: Diagnosis not present

## 2017-01-30 DIAGNOSIS — R5382 Chronic fatigue, unspecified: Secondary | ICD-10-CM | POA: Diagnosis not present

## 2017-01-30 DIAGNOSIS — G4709 Other insomnia: Secondary | ICD-10-CM | POA: Diagnosis not present

## 2017-01-30 DIAGNOSIS — D649 Anemia, unspecified: Secondary | ICD-10-CM | POA: Diagnosis not present

## 2017-01-30 DIAGNOSIS — K9041 Non-celiac gluten sensitivity: Secondary | ICD-10-CM | POA: Diagnosis not present

## 2017-01-30 DIAGNOSIS — Z13220 Encounter for screening for lipoid disorders: Secondary | ICD-10-CM | POA: Diagnosis not present

## 2017-03-03 DIAGNOSIS — Z6837 Body mass index (BMI) 37.0-37.9, adult: Secondary | ICD-10-CM | POA: Diagnosis not present

## 2017-03-03 DIAGNOSIS — R5382 Chronic fatigue, unspecified: Secondary | ICD-10-CM | POA: Diagnosis not present

## 2017-03-03 DIAGNOSIS — G4700 Insomnia, unspecified: Secondary | ICD-10-CM | POA: Diagnosis not present

## 2017-03-27 MED FILL — ESZOPICLONE 3 MG TABLET: 3 | 30 days supply | Qty: 30 | Fill #0

## 2017-04-24 MED FILL — DULoxetine HCL 60 MG CPEP: 60 | 90 days supply | Qty: 90 | Fill #0

## 2017-05-15 MED FILL — ESZOPICLONE 3 MG TABLET: 3 | 30 days supply | Qty: 30 | Fill #1

## 2017-07-05 DIAGNOSIS — I10 Essential (primary) hypertension: Secondary | ICD-10-CM | POA: Diagnosis not present

## 2017-07-05 DIAGNOSIS — R232 Flushing: Secondary | ICD-10-CM | POA: Diagnosis not present

## 2017-07-05 MED FILL — AMLODIPINE BESYLATE 5 MG TA: 5 | 30 days supply | Qty: 30 | Fill #0

## 2017-07-12 MED FILL — AMLODIPINE BESYLATE 10 MG T: 10 | 30 days supply | Qty: 30 | Fill #0

## 2017-08-11 MED FILL — DULoxetine HCL 60 MG CPEP: 60 | 90 days supply | Qty: 90 | Fill #1

## 2017-08-15 DIAGNOSIS — I10 Essential (primary) hypertension: Secondary | ICD-10-CM | POA: Diagnosis not present

## 2017-08-15 DIAGNOSIS — D509 Iron deficiency anemia, unspecified: Secondary | ICD-10-CM | POA: Diagnosis not present

## 2017-08-15 DIAGNOSIS — G4709 Other insomnia: Secondary | ICD-10-CM | POA: Diagnosis not present

## 2017-08-15 MED FILL — AMLODIPINE BESYLATE 10 MG T: 10 | 90 days supply | Qty: 90 | Fill #0

## 2017-08-15 MED FILL — ESZOPICLONE 3 MG TABS: 3 | 90 days supply | Qty: 90 | Fill #0

## 2017-11-20 MED FILL — AMLODIPINE BESYLATE 10 MG T: 10 | 30 days supply | Qty: 30 | Fill #0

## 2017-12-15 MED FILL — DULoxetine HCL 60 MG CPEP: 60 | 30 days supply | Qty: 30 | Fill #0

## 2017-12-15 MED FILL — AMLODIPINE BESYLATE 10 MG T: 10 | 30 days supply | Qty: 30 | Fill #0 | Status: TO

## 2017-12-18 MED FILL — ESZOPICLONE 3 MG TABLET: 3 | 25 days supply | Qty: 15 | Fill #0

## 2017-12-20 DIAGNOSIS — D509 Iron deficiency anemia, unspecified: Secondary | ICD-10-CM | POA: Diagnosis not present

## 2017-12-20 DIAGNOSIS — I10 Essential (primary) hypertension: Secondary | ICD-10-CM | POA: Diagnosis not present

## 2017-12-20 DIAGNOSIS — R5382 Chronic fatigue, unspecified: Secondary | ICD-10-CM | POA: Diagnosis not present

## 2017-12-26 MED FILL — PENICILLIN VK 500 MG TABLET: 500 | 10 days supply | Qty: 30 | Fill #0

## 2017-12-27 DIAGNOSIS — K006 Disturbances in tooth eruption: Secondary | ICD-10-CM | POA: Diagnosis not present

## 2017-12-27 MED FILL — IBUPROFEN 600 MG TABLET: 600 | 4 days supply | Qty: 10 | Fill #0

## 2018-01-15 MED FILL — ESZOPICLONE 3 MG TABLET: 3 | 15 days supply | Qty: 15 | Fill #1

## 2018-01-18 DIAGNOSIS — R7989 Other specified abnormal findings of blood chemistry: Secondary | ICD-10-CM | POA: Diagnosis not present

## 2018-01-25 MED FILL — traZODone HCL 50 MG TABS: 50 | 30 days supply | Qty: 30 | Fill #0 | Status: TO

## 2018-01-25 MED FILL — DULoxetine HCL 60 MG CPEP: 60 | 30 days supply | Qty: 30 | Fill #1 | Status: TO

## 2018-03-12 MED FILL — ESZOPICLONE 3 MG TABLET: 3 | 15 days supply | Qty: 15 | Fill #2

## 2018-03-13 DIAGNOSIS — R7989 Other specified abnormal findings of blood chemistry: Secondary | ICD-10-CM | POA: Diagnosis not present

## 2018-03-22 DIAGNOSIS — R7989 Other specified abnormal findings of blood chemistry: Secondary | ICD-10-CM | POA: Diagnosis not present

## 2018-04-02 ENCOUNTER — Encounter (HOSPITAL_BASED_OUTPATIENT_CLINIC_OR_DEPARTMENT_OTHER): Payer: Self-pay | Admitting: Emergency Medicine

## 2018-04-02 ENCOUNTER — Emergency Department (HOSPITAL_BASED_OUTPATIENT_CLINIC_OR_DEPARTMENT_OTHER)
Admission: EM | Admit: 2018-04-02 | Discharge: 2018-04-02 | Disposition: A | Discharge status: Home / Self Care | Payer: Managed Care, Other (non HMO) | Attending: Emergency Medicine | Admitting: Emergency Medicine

## 2018-04-02 ENCOUNTER — Other Ambulatory Visit: Payer: Self-pay

## 2018-04-02 DIAGNOSIS — I10 Essential (primary) hypertension: Secondary | ICD-10-CM | POA: Diagnosis not present

## 2018-04-02 DIAGNOSIS — R55 Syncope and collapse: Secondary | ICD-10-CM

## 2018-04-02 DIAGNOSIS — R0602 Shortness of breath: Secondary | ICD-10-CM | POA: Diagnosis not present

## 2018-04-02 DIAGNOSIS — R42 Dizziness and giddiness: Secondary | ICD-10-CM | POA: Diagnosis not present

## 2018-04-02 DIAGNOSIS — Z79899 Other long term (current) drug therapy: Secondary | ICD-10-CM | POA: Diagnosis not present

## 2018-04-02 DIAGNOSIS — E876 Hypokalemia: Secondary | ICD-10-CM

## 2018-04-02 DIAGNOSIS — T670XXA Heatstroke and sunstroke, initial encounter: Secondary | ICD-10-CM | POA: Diagnosis not present

## 2018-04-02 DIAGNOSIS — R0689 Other abnormalities of breathing: Secondary | ICD-10-CM | POA: Diagnosis not present

## 2018-04-02 DIAGNOSIS — R000 Tachycardia, unspecified: Secondary | ICD-10-CM | POA: Diagnosis not present

## 2018-04-02 HISTORY — DX: Essential (primary) hypertension: I10

## 2018-04-02 HISTORY — DX: Fibromyalgia: M79.7

## 2018-04-02 LAB — CBC WITH DIFFERENTIAL/PLATELET
Basophils Absolute: 0 10*3/uL (ref 0.0–0.1)
Basophils Relative: 0 %
Eosinophils Absolute: 0 10*3/uL (ref 0.0–0.7)
Eosinophils Relative: 0 %
HEMATOCRIT: 36.9 % (ref 36.0–46.0)
Hemoglobin: 12.6 g/dL (ref 12.0–15.0)
LYMPHS ABS: 1.5 10*3/uL (ref 0.7–4.0)
LYMPHS PCT: 20 %
MCH: 26 pg (ref 26.0–34.0)
MCHC: 34.1 g/dL (ref 30.0–36.0)
MCV: 76.2 fL — AB (ref 78.0–100.0)
MONO ABS: 0.6 10*3/uL (ref 0.1–1.0)
Monocytes Relative: 9 %
NEUTROS ABS: 5.3 10*3/uL (ref 1.7–7.7)
Neutrophils Relative %: 71 %
Platelets: 371 10*3/uL (ref 150–400)
RBC: 4.84 MIL/uL (ref 3.87–5.11)
RDW: 15.4 % (ref 11.5–15.5)
WBC: 7.4 10*3/uL (ref 4.0–10.5)

## 2018-04-02 LAB — BASIC METABOLIC PANEL
ANION GAP: 7 (ref 5–15)
BUN: 11 mg/dL (ref 6–20)
CHLORIDE: 103 mmol/L (ref 101–111)
CO2: 28 mmol/L (ref 22–32)
Calcium: 9 mg/dL (ref 8.9–10.3)
Creatinine, Ser: 0.92 mg/dL (ref 0.44–1.00)
GFR calc Af Amer: >60 mL/min (ref >60)
GFR calc non Af Amer: >60 mL/min (ref >60)
Glucose, Bld: 90 mg/dL (ref 65–99)
POTASSIUM: 3.2 mmol/L — AB (ref 3.5–5.1)
Sodium: 138 mmol/L (ref 135–145)

## 2018-04-02 MED ORDER — POTASSIUM CHLORIDE CRYS ER 20 MEQ PO TBCR
40.0000 meq | EXTENDED_RELEASE_TABLET | Freq: Once | ORAL | Status: AC
  Administered 2018-04-02: 40 meq via ORAL
  Filled 2018-04-02: qty 2

## 2018-04-02 MED ORDER — SODIUM CHLORIDE 0.9 % IV BOLUS
1000.0000 mL | Freq: Once | INTRAVENOUS | Status: AC
  Administered 2018-04-02: 1000 mL via INTRAVENOUS

## 2018-04-02 NOTE — ED Provider Notes (Signed)
[facility] EMERGENCY DEPARTMENT Provider Note   CSN: [id number] Arrival date & time: 04/02/18  1940     History   Chief Complaint Chief Complaint  Patient presents with  . Near Syncope    HPI Barbara Mcdaniel is a 37 y.o. female with a past medical history of hypertension, fibromyalgia, status post hysterectomy, who presents today for evaluation of a near syncopal event.  She reports that she was working out in a gym and had been working out for approximately half an hour.  She has been doing a similar workout for the past 2 months and this is nothing new, however she reports feeling like the gym was hotter than usual and that the air conditioning was not on.  She reports that she was attempting to orally hydrate. She did not have a headache at any point during this. The light headedness did not start while she was doing a squat, lifting or other pressure activity.   She did not fully pass out.   HPI  Past Medical History:  Diagnosis Date  . Fibromyalgia   . Hypertension   . Iron deficiency anemia 03/16/2015  . Menometrorrhagia 03/16/2015    Patient Active Problem List   Diagnosis Date Noted  . Anemia, iron deficiency 10/01/2015  . Hot flashes 04/28/2015  . Tinea corporis 04/28/2015  . Myofascial pain syndrome 11/18/2014  . Tibialis posterior tendinitis 05/19/2014  . Obesity 04/10/2014  . Essential hypertension, benign 04/10/2014  . Status post total hysterectomy 04/10/2014  . Preventive measure 04/10/2014    Past Surgical History:  Procedure Laterality Date  . ABDOMINAL HYSTERECTOMY    . CESAREAN SECTION     07/16/2000 and 06/29/2004     OB History   None      Home Medications    Prior to Admission medications   Medication Sig Start Date End Date Taking? Authorizing Provider  DULoxetine (CYMBALTA) 60 MG capsule Take 1 capsule (60 mg total) by mouth daily. 02/06/15  Yes Monica Bectonhekkekandam, Thomas J, MD  lisinopril-hydrochlorothiazide (PRINZIDE,ZESTORETIC)  20-25 MG tablet Take 1 tablet by mouth daily. APPOINTMENT NEEDED FOR FURTHER REFILLS 11/03/15  Yes Monica Bectonhekkekandam, Thomas J, MD  methocarbamol (ROBAXIN) 500 MG tablet  03/11/15   [provider]  phentermine (ADIPEX-P) 37.5 MG tablet One tab by mouth qAM 07/09/15   Monica Bectonhekkekandam, Thomas J, MD    Family History Family History  Problem Relation Age of Onset  . Hypertension Mother   . Hyperlipidemia Mother   . Hypertension Father   . Hypertension Sister   . Diabetes Maternal Aunt   . Diabetes Maternal Uncle   . Hypertension Maternal Uncle   . Stroke Maternal Uncle   . Heart disease Maternal Grandfather     Social History Social History   Tobacco Use  . Smoking status: Never Smoker  . Smokeless tobacco: Never Used  . Tobacco comment: NEVER USED TOBACCO  Substance Use Topics  . Alcohol use: Yes    Alcohol/week: 0.0 oz  . Drug use: Not on file     Allergies   Patient has no known allergies.   Review of Systems Review of Systems  Constitutional: Positive for diaphoresis. Negative for chills and fever.  HENT: Negative for ear pain and sore throat.   Eyes: Negative for pain and visual disturbance.  Respiratory: Negative for cough and shortness of breath.   Cardiovascular: Negative for chest pain and palpitations.  Gastrointestinal: Negative for abdominal pain and vomiting.  Musculoskeletal: Negative for arthralgias, back pain,  neck pain and neck stiffness.  Skin: Negative for color change and rash.  Neurological: Negative for dizziness, syncope (Near syncope), facial asymmetry, weakness and headaches.  Psychiatric/Behavioral: Negative for decreased concentration.  All other systems reviewed and are negative.    Physical Exam Updated Vital Signs BP 127/87 (BP Location: Right Arm)   Pulse 74   Temp 98.9 F (37.2 C) (Oral)   Resp 18   Ht 5\' 9"  (1.753 m)   Wt 108.9 kg (240 lb)   SpO2 100%   BMI 35.44 kg/m   Physical Exam  Constitutional: She appears  well-developed and well-nourished. No distress.  HENT:  Head: Normocephalic and atraumatic.  Mouth/Throat: Oropharynx is clear and moist.  Eyes: Pupils are equal, round, and reactive to light. Conjunctivae and EOM are normal. Right eye exhibits no discharge. Left eye exhibits no discharge. No scleral icterus.  Neck: Normal range of motion. Neck supple.  Cardiovascular: Normal rate, regular rhythm, normal heart sounds and intact distal pulses.  Pulmonary/Chest: Effort normal and breath sounds normal. No stridor. No respiratory distress.  Abdominal: Soft. Bowel sounds are normal. She exhibits no distension. There is no tenderness.  Musculoskeletal: She exhibits no edema or deformity.  Neurological: She is alert. She exhibits normal muscle tone.  Mental Status:  Alert, oriented, thought content appropriate, able to give a coherent history. Speech fluent without evidence of aphasia. Able to follow 2 step commands without difficulty.  Cranial Nerves:  II:  Peripheral visual fields grossly normal, pupils equal, round, reactive to light III,IV, VI: ptosis not present, extra-ocular motions intact bilaterally  V,VII: smile symmetric, facial light touch sensation equal VIII: hearing grossly normal to voice  X: uvula elevates symmetrically  XI: bilateral shoulder shrug symmetric and strong XII: midline tongue extension without fassiculations Motor:  Normal tone. 5/5 in upper and lower extremities bilaterally including strong and equal grip strength and dorsiflexion/plantar flexion Cerebellar: normal finger-to-nose with bilateral upper extremities Gait: normal gait and balance CV: distal pulses palpable throughout    Skin: Skin is warm and dry. She is not diaphoretic.  Psychiatric: She has a normal mood and affect. Her behavior is normal.  Nursing note and vitals reviewed.    ED Treatments / Results  Labs (all labs ordered are listed, but only abnormal results are displayed) Labs Reviewed    BASIC METABOLIC PANEL - Abnormal; Notable for the following components:      Result Value   Potassium 3.2 (*)    All other components within normal limits  CBC WITH DIFFERENTIAL/PLATELET - Abnormal; Notable for the following components:   MCV 76.2 (*)    All other components within normal limits    EKG None  Radiology No results found.  Procedures Procedures (including critical care time)  Medications Ordered in ED Medications  sodium chloride 0.9 % bolus 1,000 mL (0 mLs Intravenous Stopped 04/02/18 2130)  potassium chloride SA (K-DUR,KLOR-CON) CR tablet 40 mEq (40 mEq Oral Given 04/02/18 2056)     Initial Impression / Assessment and Plan / ED Course  I have reviewed the triage vital signs and the nursing notes.  Pertinent labs & imaging results that were available during my care of the patient were reviewed by me and considered in my medical decision making (see chart for details).  Clinical Course as of Apr 03 16  Mon Apr 02, 2018  2159 Patient reevaluated, states that she feels ready to go home, she reports that she has been up multiple times to urinate withoutFeeling lightheaded  or dizzy.  Asked tech to ambulate patient around the whole department, as long as she does not become symptomatic will discharge home.     [EH]    Clinical Course User Index [EH] Cristina Gong, PA-C   Patient presents today after evaluation of a near syncopal event.  She was at the gym working out and it was harder than usual when she started feeling lightheaded.  She denies any headache during this, no chest pain.  She had a near syncopal event exacerbated by position changes, however she did not fully pass out or lose consciousness.  She was not performing squats, lifting, or other activities that would potentially rupture a very aneurysm if she had one.  She does not have headaches.  No chest pain.  Discussed EKG with patient who declined.   Labs were obtained, mild hypokalemia which was  orally repleted.  She was given 1 L IV fluids and allowed to orally rehydrate and eat, after which she reported full resolution in her symptoms, she was able to ambulate in the department without difficulties or feeling lightheaded.  Suspect dehydration as a cause for her symptoms.  Return precautions and PCP follow-up were discussed, patient discharged home.   Final Clinical Impressions(s) / ED Diagnoses   Final diagnoses:  Near syncope  Hypokalemia    ED Discharge Orders    None       Norman Clay 04/03/18 0018    Arby Barrette, MD 04/12/18 2239

## 2018-04-02 NOTE — Discharge Instructions (Signed)
Today your potassium was slightly low.  Please follow up with your doctor about this.

## 2018-04-02 NOTE — ED Triage Notes (Signed)
Pt to ED via EMS s/p near syncopal episode while working out in an [employer]

## 2018-04-02 NOTE — ED Notes (Signed)
ED Provider at bedside. 

## 2018-04-02 NOTE — ED Notes (Signed)
Apple juice provided to pt

## 2018-05-30 MED FILL — ESZOPICLONE 3 MG TABS: 3 | 15 days supply | Qty: 15 | Fill #3

## 2018-07-03 DIAGNOSIS — Z1331 Encounter for screening for depression: Secondary | ICD-10-CM | POA: Diagnosis not present

## 2018-07-03 DIAGNOSIS — E559 Vitamin D deficiency, unspecified: Secondary | ICD-10-CM | POA: Diagnosis not present

## 2018-07-03 DIAGNOSIS — E7800 Pure hypercholesterolemia, unspecified: Secondary | ICD-10-CM | POA: Diagnosis not present

## 2018-07-03 DIAGNOSIS — E876 Hypokalemia: Secondary | ICD-10-CM | POA: Diagnosis not present

## 2018-07-03 DIAGNOSIS — Z1339 Encounter for screening examination for other mental health and behavioral disorders: Secondary | ICD-10-CM | POA: Diagnosis not present

## 2018-07-03 DIAGNOSIS — E669 Obesity, unspecified: Secondary | ICD-10-CM | POA: Diagnosis not present

## 2018-07-03 DIAGNOSIS — N951 Menopausal and female climacteric states: Secondary | ICD-10-CM | POA: Diagnosis not present

## 2018-07-03 DIAGNOSIS — R7989 Other specified abnormal findings of blood chemistry: Secondary | ICD-10-CM | POA: Diagnosis not present

## 2018-07-03 DIAGNOSIS — I10 Essential (primary) hypertension: Secondary | ICD-10-CM | POA: Diagnosis not present

## 2018-07-18 DIAGNOSIS — K011 Impacted teeth: Secondary | ICD-10-CM | POA: Diagnosis not present

## 2018-08-06 DIAGNOSIS — I10 Essential (primary) hypertension: Secondary | ICD-10-CM | POA: Diagnosis not present

## 2018-08-06 DIAGNOSIS — E669 Obesity, unspecified: Secondary | ICD-10-CM | POA: Diagnosis not present

## 2018-08-06 DIAGNOSIS — R635 Abnormal weight gain: Secondary | ICD-10-CM | POA: Diagnosis not present

## 2018-08-06 DIAGNOSIS — Z713 Dietary counseling and surveillance: Secondary | ICD-10-CM | POA: Diagnosis not present

## 2018-08-22 DIAGNOSIS — Z713 Dietary counseling and surveillance: Secondary | ICD-10-CM | POA: Diagnosis not present

## 2018-08-22 DIAGNOSIS — E669 Obesity, unspecified: Secondary | ICD-10-CM | POA: Diagnosis not present

## 2018-08-22 DIAGNOSIS — E7800 Pure hypercholesterolemia, unspecified: Secondary | ICD-10-CM | POA: Diagnosis not present

## 2018-08-31 DIAGNOSIS — I10 Essential (primary) hypertension: Secondary | ICD-10-CM | POA: Diagnosis not present

## 2018-08-31 DIAGNOSIS — Z713 Dietary counseling and surveillance: Secondary | ICD-10-CM | POA: Diagnosis not present

## 2018-08-31 DIAGNOSIS — E559 Vitamin D deficiency, unspecified: Secondary | ICD-10-CM | POA: Diagnosis not present

## 2018-08-31 DIAGNOSIS — E669 Obesity, unspecified: Secondary | ICD-10-CM | POA: Diagnosis not present

## 2018-09-05 DIAGNOSIS — I10 Essential (primary) hypertension: Secondary | ICD-10-CM | POA: Diagnosis not present

## 2018-09-05 DIAGNOSIS — Z713 Dietary counseling and surveillance: Secondary | ICD-10-CM | POA: Diagnosis not present

## 2018-09-05 DIAGNOSIS — E7800 Pure hypercholesterolemia, unspecified: Secondary | ICD-10-CM | POA: Diagnosis not present

## 2018-09-05 DIAGNOSIS — N951 Menopausal and female climacteric states: Secondary | ICD-10-CM | POA: Diagnosis not present

## 2018-09-05 DIAGNOSIS — E559 Vitamin D deficiency, unspecified: Secondary | ICD-10-CM | POA: Diagnosis not present

## 2018-09-05 DIAGNOSIS — E669 Obesity, unspecified: Secondary | ICD-10-CM | POA: Diagnosis not present

## 2018-10-22 DIAGNOSIS — E559 Vitamin D deficiency, unspecified: Secondary | ICD-10-CM | POA: Diagnosis not present

## 2018-10-22 DIAGNOSIS — R293 Abnormal posture: Secondary | ICD-10-CM | POA: Diagnosis not present

## 2018-10-22 DIAGNOSIS — M545 Low back pain: Secondary | ICD-10-CM | POA: Diagnosis not present

## 2018-10-22 DIAGNOSIS — M797 Fibromyalgia: Secondary | ICD-10-CM | POA: Diagnosis not present

## 2018-10-22 DIAGNOSIS — E7800 Pure hypercholesterolemia, unspecified: Secondary | ICD-10-CM | POA: Diagnosis not present

## 2018-10-22 DIAGNOSIS — M5441 Lumbago with sciatica, right side: Secondary | ICD-10-CM | POA: Diagnosis not present

## 2018-10-22 DIAGNOSIS — I10 Essential (primary) hypertension: Secondary | ICD-10-CM | POA: Diagnosis not present

## 2018-10-23 DIAGNOSIS — H5212 Myopia, left eye: Secondary | ICD-10-CM | POA: Diagnosis not present

## 2018-10-23 DIAGNOSIS — H52223 Regular astigmatism, bilateral: Secondary | ICD-10-CM | POA: Diagnosis not present

## 2018-11-05 DIAGNOSIS — M545 Low back pain: Secondary | ICD-10-CM | POA: Diagnosis not present

## 2018-11-05 DIAGNOSIS — G4709 Other insomnia: Secondary | ICD-10-CM | POA: Diagnosis not present

## 2018-11-05 DIAGNOSIS — M797 Fibromyalgia: Secondary | ICD-10-CM | POA: Diagnosis not present

## 2018-11-05 DIAGNOSIS — I10 Essential (primary) hypertension: Secondary | ICD-10-CM | POA: Diagnosis not present

## 2018-11-07 DIAGNOSIS — M545 Low back pain: Secondary | ICD-10-CM | POA: Diagnosis not present

## 2018-11-07 DIAGNOSIS — R293 Abnormal posture: Secondary | ICD-10-CM | POA: Diagnosis not present

## 2018-11-07 DIAGNOSIS — M797 Fibromyalgia: Secondary | ICD-10-CM | POA: Diagnosis not present

## 2018-11-07 DIAGNOSIS — M5441 Lumbago with sciatica, right side: Secondary | ICD-10-CM | POA: Diagnosis not present

## 2020-03-31 ENCOUNTER — Telehealth: Payer: Self-pay | Admitting: Sports Medicine

## 2020-03-31 NOTE — Telephone Encounter (Signed)
Patient would like to come and see you to discuss weight loss. Last office visit was 06/2015. Can patient re-establish with you?

## 2020-03-31 NOTE — Telephone Encounter (Signed)
Unfortunately I have stopped taking new patients for primary care, several of my partners are still taking new patients.  I would encourage her to discuss Wegov[facility] with them

## 2020-04-01 NOTE — Telephone Encounter (Signed)
Appointment has been made. No further questions at this time.  

## 2020-04-10 ENCOUNTER — Ambulatory Visit: Payer: 59 | Admitting: Medical-Surgical

## 2020-12-30 ENCOUNTER — Encounter: Payer: Self-pay | Admitting: Plastic Surgery

## 2020-12-30 ENCOUNTER — Ambulatory Visit: Payer: 59 | Admitting: Plastic Surgery

## 2020-12-30 ENCOUNTER — Other Ambulatory Visit: Payer: Self-pay

## 2020-12-30 VITALS — BP 121/72 | HR 93 | Ht 69.0 in | Wt 231.0 lb

## 2020-12-30 DIAGNOSIS — M546 Pain in thoracic spine: Secondary | ICD-10-CM

## 2020-12-30 DIAGNOSIS — M4004 Postural kyphosis, thoracic region: Secondary | ICD-10-CM

## 2020-12-30 DIAGNOSIS — Z411 Encounter for cosmetic surgery: Secondary | ICD-10-CM

## 2020-12-30 DIAGNOSIS — M545 Low back pain, unspecified: Secondary | ICD-10-CM

## 2020-12-30 DIAGNOSIS — N62 Hypertrophy of breast: Secondary | ICD-10-CM | POA: Diagnosis not present

## 2020-12-30 NOTE — Progress Notes (Signed)
Referring Provider No referring provider defined for this encounter.   CC:  Chief Complaint  Patient presents with  . Advice Only      Barbara Mcdaniel is an 40 y.o. female.  HPI: Patient presents to discuss breast reduction.  She is shoulder grooving related to her large breasts.  She is tried over-the-counter medications, warm packs, cold packs and supportive bras with little relief.  She is currently double D and wants to be around a C cup.  She all so intermittently gets skin irritation beneath her breast moisture but has not had any rashes.  She has no family history of breast cancer no prior breast procedures.  She does not smoke and is not a diabetic.  She goes to the chiropractor on a regular basis with little sustained relief and she has had a mammogram before a few months ago and that was normal.  She has had some cosmetic procedures before including a mini tummy tuck and some liposuction of her flanks.  She is interested in additional liposuction of her back and flanks.  No Known Allergies  Outpatient Encounter Medications as of 12/30/2020  Medication Sig Note  . amLODipine (NORVASC) 10 MG tablet Take 10 mg by mouth daily.   . DULoxetine (CYMBALTA) 60 MG capsule Take 1 capsule (60 mg total) by mouth daily.   . phentermine (ADIPEX-P) 37.5 MG tablet One tab by mouth qAM   . pregabalin (LYRICA) 75 MG capsule Take 75 mg by mouth 2 (two) times daily.   . traZODone (DESYREL) 100 MG tablet Take 100 mg by mouth at bedtime.   . [DISCONTINUED] lisinopril-hydrochlorothiazide (PRINZIDE,ZESTORETIC) 20-25 MG tablet Take 1 tablet by mouth daily. APPOINTMENT NEEDED FOR FURTHER REFILLS   . [DISCONTINUED] methocarbamol (ROBAXIN) 500 MG tablet  03/16/2015: Received from: External Pharmacy   No facility-administered encounter medications on file as of 12/30/2020.     Past Medical History:  Diagnosis Date  . Fibromyalgia   . Hypertension   . Iron deficiency anemia 03/16/2015  .  Menometrorrhagia 03/16/2015    Past Surgical History:  Procedure Laterality Date  . ABDOMINAL HYSTERECTOMY    . CESAREAN SECTION     07/16/2000 and 06/29/2004    Family History  Problem Relation Age of Onset  . Hypertension Mother   . Hyperlipidemia Mother   . Hypertension Father   . Hypertension Sister   . Diabetes Maternal Aunt   . Diabetes Maternal Uncle   . Hypertension Maternal Uncle   . Stroke Maternal Uncle   . Heart disease Maternal Grandfather     Social History   Social History Narrative  . Not on file     Review of Systems General: Denies fevers, chills, weight loss CV: Denies chest pain, shortness of breath, palpitations  Physical Exam Vitals with BMI 12/30/2020 04/02/2018 04/02/2018  Height 5\' 9"  - -  Weight 231 lbs - -  BMI 34.1 - -  Systolic 121 127  Diastolic 72 87 86  Pulse 93 74 77    General:  No acute distress,  Alert and oriented, Non-Toxic, Normal speech and affect Breast: She has grade 3 ptosis.  Sternal notch to nipple is 38 cm bilaterally.  Nipple to fold is 22 cm bilaterally.  I do not see any obvious scars or masses.  Abdomen: Abdomen is soft nontender.  She has a lower transverse scar from her mini tummy tuck.  She has some excess adipose tissue and skin on her flanks extending around her  back.  These are the areas that bother her the most at this point.  Assessment/Plan The patient has bilateral symptomatic macromastia.  She is a good candidate for a breast reduction.  She is interested in pursuing surgical treatment.  She has tried supportive garments and fitted bras with no relief.  The details of breast reduction surgery were discussed.  I explained the procedure in detail along the with the expected scars.  The risks were discussed in detail and include bleeding, infection, damage to surrounding structures, need for additional procedures, nipple loss, change in nipple sensation, persistent pain, contour irregularities and asymmetries.  I  explained that breast feeding is often not possible after breast reduction surgery.  We discussed the expected postoperative course with an overall recovery period of about 1 month.  She demonstrated full understanding of all risks.  We discussed her personal risk factors that include the length of her breast.  I explained that due to the length of her breast there would be some limitations in terms of how small I could make her with a traditional pedicled reduction.  I would estimate that I could get about 3 pounds of breast tissue per side off in the traditional pedicled technique but that would still leave some left behind.  She is particularly focused on being at least as small as a C cup if not slightly smaller.  This prompted discussion about free nipple graft.  I explained that this technique I can make her basically smaller she wanted to be without risk of compromising vascularity of the tissues.  I explained that with the free nipple graft technique the nipple would be insensate and likely come back at a slightly different color and perhaps have some irregular pigmentation.  She does not seem to be particularly bothered by these drawbacks and would rather err on the side of making herself smaller.  I think she is a reasonable candidate for free nipple graft given all the above.  I anticipate approximately 1000g of tissue removed from each side.  Regarding her flanks and back areas I explained that while these areas could be like liposuction and more aggressively there would still be some excess skin.  I explained the only way to truly correct the deformity would be to liposuction it and also excise the skin but this would create a scar wrapping around her flanks towards her back.  She is not comfortable with the scar at this point but would like to look into the option of more aggressive liposuction of those areas.  We will plan to provide a quote for her and this could be done at the same time as her  breast reduction.   Barbara Mcdaniel 12/30/2020, 5:47 PM

## 2021-03-09 ENCOUNTER — Encounter: Payer: Self-pay | Admitting: Surgical

## 2021-03-09 ENCOUNTER — Ambulatory Visit (INDEPENDENT_AMBULATORY_CARE_PROVIDER_SITE_OTHER): Payer: 59 | Admitting: Surgical

## 2021-03-09 ENCOUNTER — Other Ambulatory Visit: Payer: Self-pay

## 2021-03-09 VITALS — BP 120/86 | HR 102 | Ht 69.0 in | Wt 238.0 lb

## 2021-03-09 DIAGNOSIS — N62 Hypertrophy of breast: Secondary | ICD-10-CM

## 2021-03-09 DIAGNOSIS — Z411 Encounter for cosmetic surgery: Secondary | ICD-10-CM

## 2021-03-09 DIAGNOSIS — Z719 Counseling, unspecified: Secondary | ICD-10-CM

## 2021-03-09 DIAGNOSIS — M545 Low back pain, unspecified: Secondary | ICD-10-CM

## 2021-03-09 DIAGNOSIS — M4004 Postural kyphosis, thoracic region: Secondary | ICD-10-CM

## 2021-03-09 DIAGNOSIS — M546 Pain in thoracic spine: Secondary | ICD-10-CM

## 2021-03-09 MED ORDER — HYDROCODONE-ACETAMINOPHEN 5-325 MG PO TABS: 1.0000 | ORAL_TABLET | Freq: Four times a day (QID) | ORAL | 0 refills | Status: AC | PRN

## 2021-03-09 NOTE — Progress Notes (Signed)
Patient ID: Barbara Mcdaniel, female    DOB: 02/27/1981, 40 y.o.   MRN: [id number]  Chief Complaint  Patient presents with  . Pre-op Exam      ICD-10-CM   1. Macromastia  N62   2. Back pain of thoracolumbar region  M54.50    M54.6   3. Postural kyphosis, thoracic region  M40.04   4. Encounter for cosmetic procedure  Z41.1      History of Present Illness: Barbara Mcdaniel is a 40 y.o.  female .  She presents for preoperative evaluation for upcoming procedure, Bilateral Breast Reduction via free nipple graft and liposuction of back and flanks, scheduled for 03/26/2021 with Dr.  Arita Miss  The patient has not had problems with anesthesia. No history of DVT/PE.  No family history of DVT/PE.  No family or personal history of bleeding or clotting disorders.  Patient is not currently taking any blood thinners.  No history of CVA/MI.   Summary of Previous Visit: Patient is currently double D and wants to be around a C cup.  STN is 38 cm bilaterally.   Estimated excess breast tissue to be removed at time of surgery: 1000 grams on the left and 1000 grams on the right.  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  PMH Significant for: Hypertension, fibromyalgia. She is a kidney donor for her sister  She reports she did test positive for COVID May 19.  She had a test yesterday which was negative.  She is not having any symptoms.  She reports today that she is unsure if she would like to undergo breast reduction via free nipple graft or under pedicle technique.  She reports that she was watching various videos on YouTube in regards to amputation technique breast reduction which made her anxious.  She is worried about loss of sensation to the nipple areola.  Past Medical History: Allergies: No Known Allergies  Current Medications:  Current Outpatient Medications:  .  amLODipine (NORVASC) 10 MG tablet, Take 10 mg by mouth daily., Disp: ,  Rfl:  .  DULoxetine (CYMBALTA) 60 MG capsule, Take 1 capsule (60 mg total) by mouth daily., Disp: 90 capsule, Rfl: 3 .  linaclotide (LINZESS) 145 MCG CAPS capsule, Take 145 mcg by mouth daily before breakfast., Disp: , Rfl:  .  pregabalin (LYRICA) 75 MG capsule, Take 75 mg by mouth 2 (two) times daily., Disp: , Rfl:  .  traZODone (DESYREL) 100 MG tablet, Take 100 mg by mouth at bedtime., Disp: , Rfl:  .  phentermine (ADIPEX-P) 37.5 MG tablet, One tab by mouth qAM (Patient not taking: Reported on 03/09/2021), Disp: 30 tablet, Rfl: 0  Past Medical Problems: Past Medical History:  Diagnosis Date  . Fibromyalgia   . Hypertension   . Iron deficiency anemia 03/16/2015  . Menometrorrhagia 03/16/2015    Past Surgical History: Past Surgical History:  Procedure Laterality Date  . ABDOMINAL HYSTERECTOMY    . CESAREAN SECTION     07/16/2000 and 06/29/2004    Social History: Social History   Socioeconomic History  . Marital status: Married    Spouse name: Not on file  . Number of children: Not on file  . Years of education: Not on file  . Highest education level: Not on file  Occupational History  . Not on file  Tobacco Use  . Smoking status: Never Smoker  . Smokeless tobacco: Never Used  . Tobacco  comment: NEVER USED TOBACCO  Substance and Sexual Activity  . Alcohol use: Yes    Alcohol/week: 0.0 standard drinks  . Drug use: Not on file  . Sexual activity: Not on file  Other Topics Concern  . Not on file  Social History Narrative  . Not on file   Social Determinants of Health   Financial Resource Strain: Not on file  Food Insecurity: Not on file  Transportation Needs: Not on file  Physical Activity: Not on file  Stress: Not on file  Social Connections: Not on file  Intimate Partner Violence: Not on file    Family History: Family History  Problem Relation Age of Onset  . Hypertension Mother   . Hyperlipidemia Mother   . Hypertension Father   . Hypertension Sister   .  Diabetes Maternal Aunt   . Diabetes Maternal Uncle   . Hypertension Maternal Uncle   . Stroke Maternal Uncle   . Heart disease Maternal Grandfather     Review of Systems: Review of Systems  Constitutional: Negative.   Respiratory: Negative.   Cardiovascular: Negative.   Gastrointestinal: Negative.   Genitourinary: Negative.   Neurological: Negative.     Physical Exam: Vital Signs BP 120/86 (BP Location: Right Arm, Patient Position: Sitting, Cuff Size: Large)   Pulse (!) 102   Ht 5\' 9"  (1.753 m)   Wt 238 lb (108 kg)   SpO2 98%   BMI 35.15 kg/m   Physical Exam Constitutional:      General: Not in acute distress.    Appearance: Normal appearance. Not ill-appearing.  HENT:     Head: Normocephalic and atraumatic.  Eyes:     Pupils: Pupils are equal, round Neck:     Musculoskeletal: Normal range of motion.  Cardiovascular:     Rate and Rhythm: Normal rate    Pulses: Normal pulses.  Pulmonary:     Effort: Pulmonary effort is normal. No respiratory distress.  Abdominal:     General: Abdomen is flat. There is no distension.  Musculoskeletal: Normal range of motion.  Skin:    General: Skin is warm and dry.     Findings: No erythema or rash.  Neurological:     General: No focal deficit present.     Mental Status: Alert and oriented to person, place, and time. Mental status is at baseline.     Motor: No weakness.  Psychiatric:        Mood and Affect: Mood normal.        Behavior: Behavior normal.    Assessment/Plan: The patient is scheduled for bilateral breast reduction with Dr. .  Risks, benefits, and alternatives of procedure discussed, questions answered and consent obtained.    Smoking Status: Non-smoker; Counseling Given?  N/A Patient reports she had a mammogram this year which was negative.  Caprini Score: 3, moderate; Risk Factors include:BMI greater than 25, and length of planned surgery. Recommendation for mechanical and pharmacological prophylaxis  while hospitalized. Encourage early ambulation.   Pictures obtained:[email or web address]   Post-op Rx sent to pharmacy: Norco  Recommend holding phentermine 2 weeks prior to surgery  Patient was provided with the breast reduction and General Surgical Risk consent document and Pain Medication Agreement prior to their appointment.  They had adequate time to read through the risk consent documents and Pain Medication Agreement. We also discussed them in person together during this preop appointment. All of their questions were answered to their satisfaction.  Recommended calling if they have any further questions.  Risk  consent form and Pain Medication Agreement to be scanned into patient's chart.  The risk that can be encountered with breast reduction were discussed and include the following but not limited to these:  Breast asymmetry, fluid accumulation, firmness of the breast, inability to breast feed, loss of nipple or areola, skin loss, decrease or no nipple sensation, fat necrosis of the breast tissue, bleeding, infection, healing delay.  There are risks of anesthesia, changes to skin sensation and injury to nerves or blood vessels.  The muscle can be temporarily or permanently injured.  You may have an allergic reaction to tape, suture, glue, blood products which can result in skin discoloration, swelling, pain, skin lesions, poor healing.  Any of these can lead to the need for revisonal surgery or stage procedures.  A reduction has potential to interfere with diagnostic procedures.  Nipple or breast piercing can increase risks of infection.  This procedure is best done when the breast is fully developed.  Changes in the breast will continue to occur over time.  Pregnancy can alter the outcomes of previous breast reduction surgery, weight gain and weigh loss can also effect the long term appearance.   The risks that can be encountered with and after liposuction were discussed and include the following but no  limited to these:  Asymmetry, fluid accumulation, firmness of the area, fat necrosis with death of fat tissue, bleeding, infection, delayed healing, anesthesia risks, skin sensation changes, injury to structures including nerves, blood vessels, and muscles which may be temporary or permanent, allergies to tape, suture materials and glues, blood products, topical preparations or injected agents, skin and contour irregularities, skin discoloration and swelling, deep vein thrombosis, cardiac and pulmonary complications, pain, which may persist, persistent pain, recurrence of the lesion, poor healing of the incision, possible need for revisional surgery or staged procedures. Thiere can also be persistent swelling, poor wound healing, rippling or loose skin, worsening of cellulite, swelling, and thermal burn or heat injury from ultrasound with the ultrasound-assisted lipoplasty technique. Any change in weight fluctuations can alter the outcome.  We discussed the possibility of amputation/free nipple graft technique due to the length of her STN.  She is understanding of the possibility that we would need to transition from a pedicle technique to a free nipple graft technique intraoperatively if she opts to prefer pedicle technique.  We discussed the risks associated with free nipple graft breast reductions, including but not limited to failure of the graft, partial loss of the graft, loss of sensation of bilateral nipple areola, complete loss of the nipple areola graft, inability to breast-feed, postoperative wounds, ongoing wound care.  We also discussed the risks associated with the pedicle technique.  We discussed that with the pedicle technique she could develop nipple areolar necrosis which would result in loss of the nipple, this would also result in ongoing wound care and possible changes in the shape of her breast.    Electronically signed by: Kermit Balo Kiril Hippe, PA-C 03/09/2021 3:58 PM

## 2021-03-10 HISTORY — PX: REDUCTION MAMMAPLASTY: SUR839

## 2021-03-26 DIAGNOSIS — N62 Hypertrophy of breast: Secondary | ICD-10-CM | POA: Diagnosis not present

## 2021-03-26 DIAGNOSIS — M545 Low back pain, unspecified: Secondary | ICD-10-CM | POA: Diagnosis not present

## 2021-03-26 DIAGNOSIS — M4004 Postural kyphosis, thoracic region: Secondary | ICD-10-CM | POA: Diagnosis not present

## 2021-03-26 DIAGNOSIS — M546 Pain in thoracic spine: Secondary | ICD-10-CM

## 2021-04-01 ENCOUNTER — Other Ambulatory Visit: Payer: Self-pay

## 2021-04-01 ENCOUNTER — Ambulatory Visit (INDEPENDENT_AMBULATORY_CARE_PROVIDER_SITE_OTHER): Payer: 59 | Admitting: Plastic Surgery

## 2021-04-01 DIAGNOSIS — Z411 Encounter for cosmetic surgery: Secondary | ICD-10-CM

## 2021-04-01 DIAGNOSIS — N62 Hypertrophy of breast: Secondary | ICD-10-CM

## 2021-04-01 MED ORDER — HYDROCODONE-ACETAMINOPHEN 5-325 MG PO TABS
1.0000 | ORAL_TABLET | ORAL | 0 refills | Status: DC | PRN
Start: 2021-04-01 — End: 2021-05-05

## 2021-04-01 NOTE — Progress Notes (Signed)
Patient presents 1 week postop from bilateral breast reduction and liposuction of her back and abdomen.  Overall she feels good and has a main complaint of burning pain towards the axilla on both sides.  On exam her breast reduction and incisions are intact and nipple areolar complexes are viable.  She is appropriately swollen 1 week postop no obvious subcutaneous fluid collections.  All liposuction port sites are looking fine with some appropriate bruising and swelling in the suction areas but nothing abnormal.  Recommended continuing to wear compressive garments and avoid strenuous activity.  I will refill her pain medicine this last time.  We will plan to see her again in a few weeks.  All of her questions were answered.

## 2021-04-09 ENCOUNTER — Telehealth: Payer: Self-pay | Admitting: Plastic Surgery

## 2021-04-09 ENCOUNTER — Encounter: Payer: Self-pay | Admitting: Plastic Surgery

## 2021-04-09 NOTE — Telephone Encounter (Signed)
Patient called to say that her right breast (on the side) is bigger than the left and it's near the incision. She said she is unable to put her arm down. It's hot to the touch and she used one of the touchless thermometer and it read 100.6. Her steristrips fell off in the shower and she noticed that there is still an open area where the nipple incision meets the incision under the breast. She said there is a little bit of drainage and it was brownish when she last looked. Please call her to advise if she may need an antibiotic. Patient will send photo through mychart as well.

## 2021-04-09 NOTE — Telephone Encounter (Signed)
Matt spoke with patient 

## 2021-04-14 ENCOUNTER — Other Ambulatory Visit: Payer: Self-pay

## 2021-04-14 ENCOUNTER — Ambulatory Visit (INDEPENDENT_AMBULATORY_CARE_PROVIDER_SITE_OTHER): Payer: 59 | Admitting: Surgical

## 2021-04-14 DIAGNOSIS — Z411 Encounter for cosmetic surgery: Secondary | ICD-10-CM

## 2021-04-14 DIAGNOSIS — N62 Hypertrophy of breast: Secondary | ICD-10-CM

## 2021-04-14 NOTE — Progress Notes (Signed)
Patient is a 40 year old female here for follow-up after bilateral breast reduction and liposuction of back and flanks with Dr. Arita Miss on 03/26/2021.  She is 3 weeks postop.  She reports she is overall doing well, she is having some tenderness and pain, mostly the right breast.  She also has a small wound of the right breast.  She is not having any infectious symptoms.  She does report that she will likes the appearance of her bilateral breasts.  Chaperone present on exam On exam bilateral NAC's are viable, breast incisions are overall intact.  She does have a small wound at the junction of the vertical limb and inframammary fold of the right breast that is approximately 4 x 3 mm.  No surrounding erythema.  There is some swelling of bilateral breasts, right slightly greater than left.    Liposuction back incisions are healing well, no erythema or cellulitic changes noted of her back or abdomen.  Recommend continue to wear compressive garment.  Recommend continue to avoid strenuous activities. Recommend Vaseline gauze to right breast wound. Discussed with the patient today that we could aspirate the right breast to evaluate for seroma given the increased swelling, however she would like to avoid that at this time.  I did discuss with her that if the swelling worsens to call our office for reevaluation.  Otherwise we will reevaluate in 3 weeks.  We will complete some FMLA and out of work forms for her today for her to return to work on May 10, 2021.  She is still having a lot of pain and does not feel as if she will be able to complete her job until that point as it requires a lot of traveling and driving.

## 2021-05-05 ENCOUNTER — Ambulatory Visit (INDEPENDENT_AMBULATORY_CARE_PROVIDER_SITE_OTHER): Payer: 59 | Admitting: Plastic Surgery

## 2021-05-05 ENCOUNTER — Other Ambulatory Visit: Payer: Self-pay

## 2021-05-05 ENCOUNTER — Encounter: Payer: Self-pay | Admitting: Plastic Surgery

## 2021-05-05 DIAGNOSIS — Z411 Encounter for cosmetic surgery: Secondary | ICD-10-CM

## 2021-05-05 NOTE — Progress Notes (Signed)
Patient presents 6 weeks postop from bilateral breast reduction and liposuction of the back and abdomen.  She feels like everything is going reasonably well.  She feels a little bit fuller on the right side laterally and there is a small spitting suture in the periareolar limb on the right side at around 2:00.  She does feel like things are settling out and is overall happy.  On exam I did remove a spitting suture from the periareolar area.  All the rest of her incisions of healed nicely.  She may be very slightly fuller laterally on the right side but this is very subtle if at all.  No obvious tenderness and no obvious subcutaneous fluid collections.  She does report her nipple sensation has returned to normal as initially it was hypersensitive.  She looks to have a reasonable result from her liposuction.  She did have excess skin along the back which we discussed initially would not be treated but she feels generally happy with her result from that as well.  We will plan to check her again in 3 months and hopefully take her postoperative photographs at that point.

## 2021-08-05 ENCOUNTER — Ambulatory Visit: Payer: 59 | Admitting: Plastic Surgery

## 2021-08-05 ENCOUNTER — Other Ambulatory Visit: Payer: Self-pay

## 2021-08-05 DIAGNOSIS — N62 Hypertrophy of breast: Secondary | ICD-10-CM

## 2021-08-05 NOTE — Progress Notes (Signed)
Patient presents about 4 months out from bilateral breast reduction and back liposuction.  She is overall happy.  She feels like her back is better in terms of the contour but not quite to the point where she hoped it would be.  We did discuss the limitations of liposuction given some excess skin in that area.  With the breast reduction she is overall happy with the shape size and symmetry.  She points out a small area at 1:00 in the right nipple areolar complex where the scar is slightly pigmented.  If she did want to do something about this down the line we could excise it but she is content to leave it be for now.  Otherwise she is happy with her progress we will plan to see her again on an as-needed basis.  All of her questions were answered.

## 2021-12-13 ENCOUNTER — Other Ambulatory Visit: Payer: Self-pay | Admitting: Family Medicine

## 2021-12-13 DIAGNOSIS — Z1231 Encounter for screening mammogram for malignant neoplasm of breast: Secondary | ICD-10-CM

## 2021-12-15 ENCOUNTER — Ambulatory Visit (INDEPENDENT_AMBULATORY_CARE_PROVIDER_SITE_OTHER): Payer: Managed Care, Other (non HMO)

## 2021-12-15 ENCOUNTER — Other Ambulatory Visit: Payer: Self-pay

## 2021-12-15 DIAGNOSIS — Z1231 Encounter for screening mammogram for malignant neoplasm of breast: Secondary | ICD-10-CM

## 2022-04-01 ENCOUNTER — Other Ambulatory Visit (HOSPITAL_BASED_OUTPATIENT_CLINIC_OR_DEPARTMENT_OTHER): Payer: Self-pay

## 2022-04-01 MED ORDER — AMOXICILLIN 875 MG PO TABS
ORAL_TABLET | ORAL | 0 refills | Status: AC
  Filled 2022-04-01: qty 20, 10d supply, fill #0

## 2022-04-08 ENCOUNTER — Other Ambulatory Visit (HOSPITAL_BASED_OUTPATIENT_CLINIC_OR_DEPARTMENT_OTHER): Payer: Self-pay

## 2022-09-29 ENCOUNTER — Other Ambulatory Visit: Payer: Self-pay | Admitting: Family Medicine

## 2022-09-29 DIAGNOSIS — Z1231 Encounter for screening mammogram for malignant neoplasm of breast: Secondary | ICD-10-CM

## 2022-12-21 ENCOUNTER — Ambulatory Visit (INDEPENDENT_AMBULATORY_CARE_PROVIDER_SITE_OTHER): Payer: 59

## 2022-12-21 DIAGNOSIS — Z1231 Encounter for screening mammogram for malignant neoplasm of breast: Secondary | ICD-10-CM | POA: Diagnosis not present

## 2023-11-22 ENCOUNTER — Other Ambulatory Visit: Payer: Self-pay | Admitting: Family Medicine

## 2023-11-22 DIAGNOSIS — Z1231 Encounter for screening mammogram for malignant neoplasm of breast: Secondary | ICD-10-CM

## 2024-01-17 ENCOUNTER — Ambulatory Visit: Payer: 59

## 2024-01-17 DIAGNOSIS — Z1231 Encounter for screening mammogram for malignant neoplasm of breast: Secondary | ICD-10-CM

## 2024-03-02 IMAGING — MG MM DIGITAL SCREENING BILAT W/ TOMO AND CAD
8 series · 8 of 24 positions shown · non-contrast
Comparison: Previous exam(s).

CLINICAL DATA: Screening.

EXAM:
DIGITAL SCREENING BILATERAL MAMMOGRAM WITH TOMOSYNTHESIS AND CAD
TECHNIQUE: Bilateral screening digital craniocaudal and mediolateral oblique
mammograms were obtained. Bilateral screening digital breast
tomosynthesis was performed. The images were evaluated with
computer-aided detection.

[R CC synth-2D]
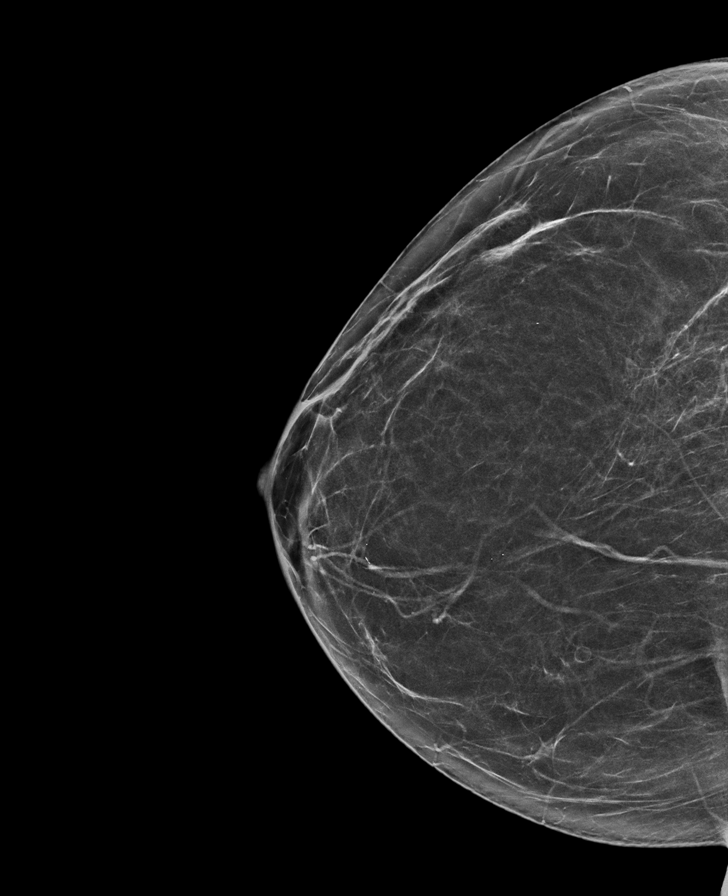

[L CC synth-2D]
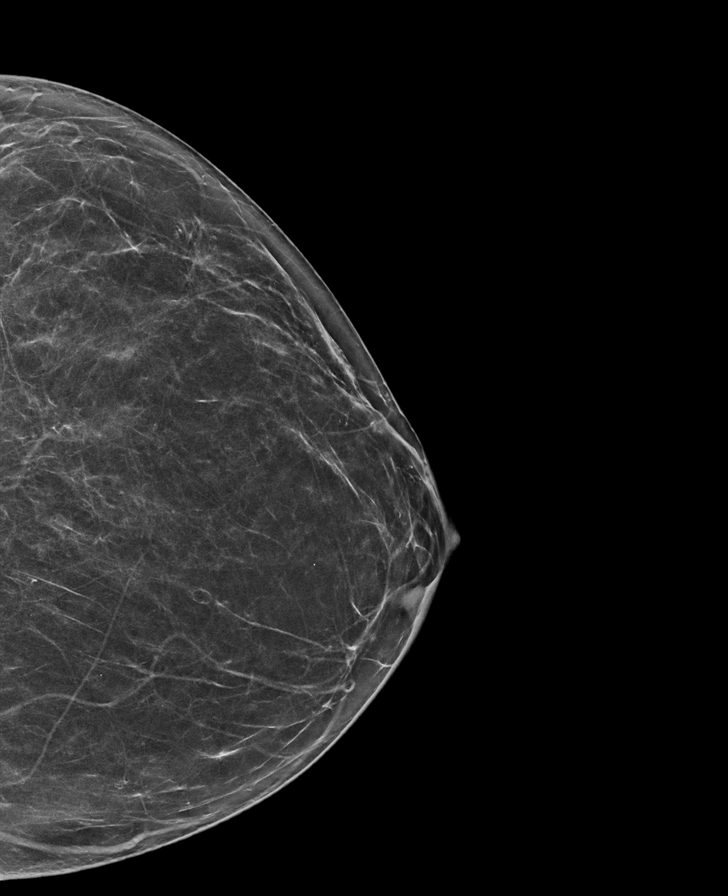

[R MLO synth-2D]
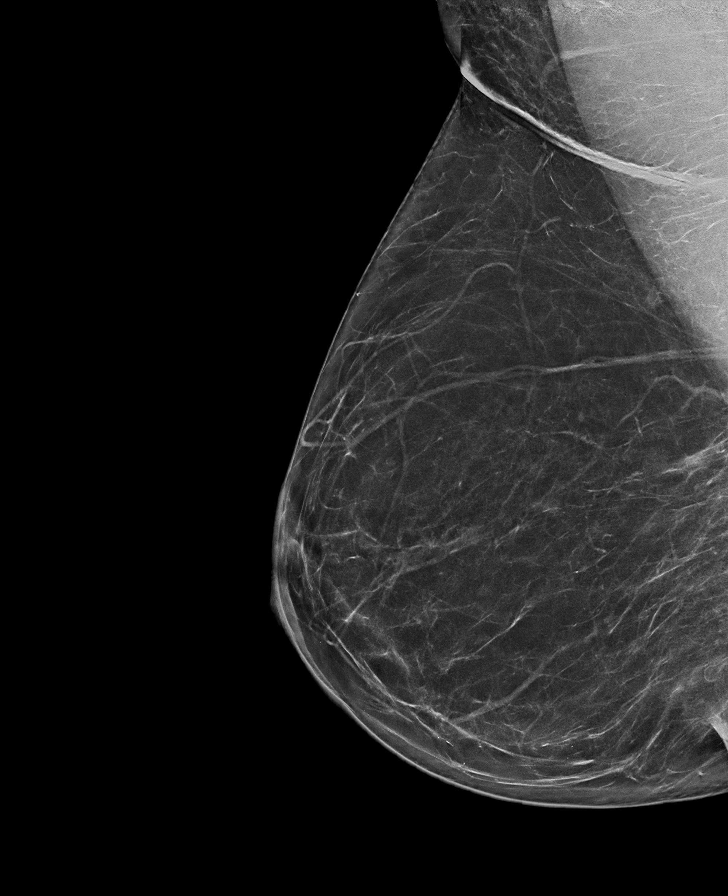

[L MLO synth-2D]
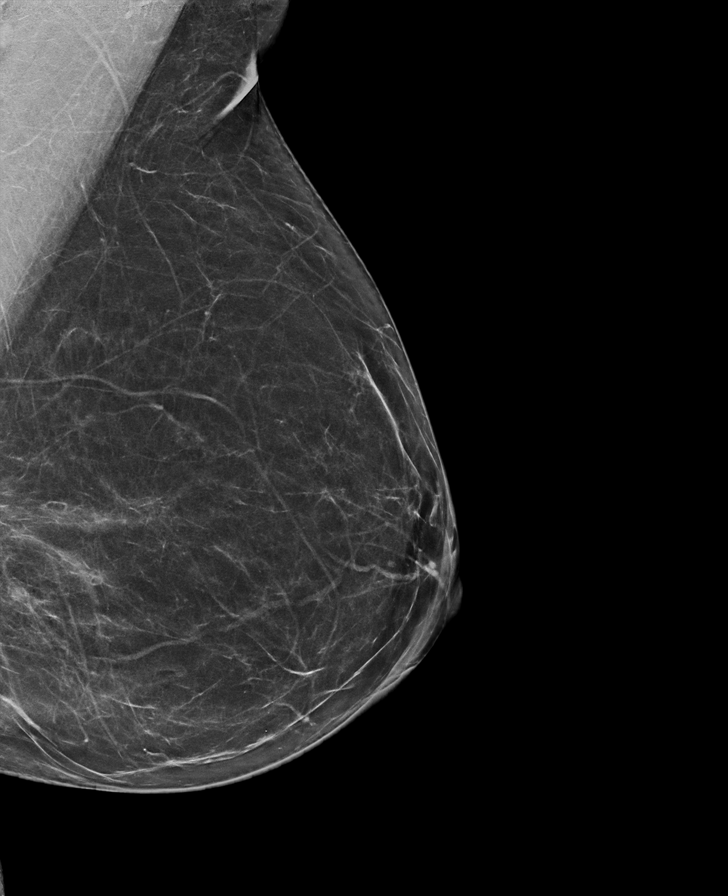

[R MLO tomo · tomo slice 37/74.0]
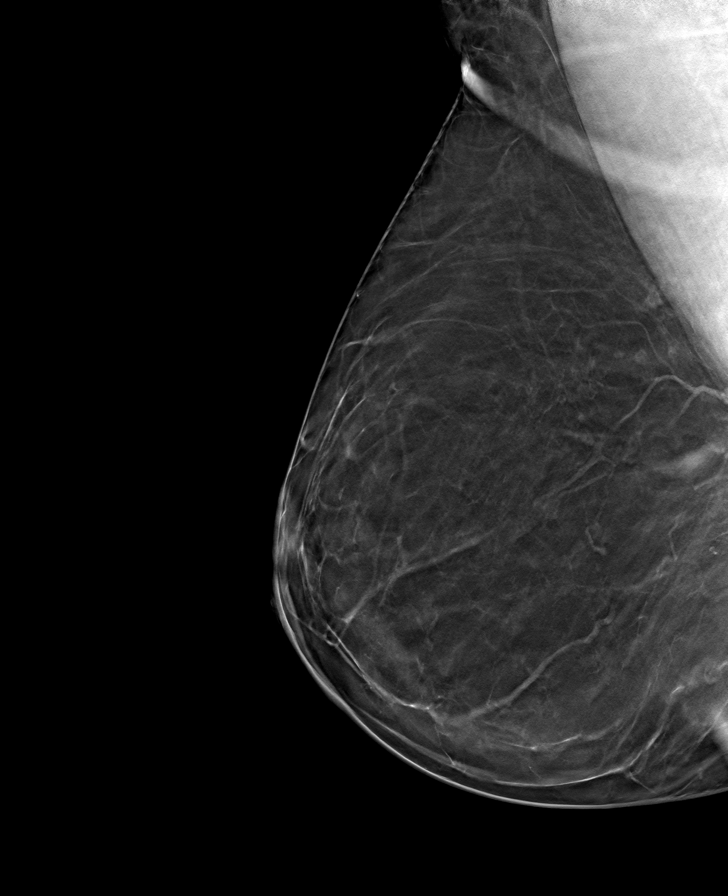

[L MLO tomo · tomo slice 36/71.0]
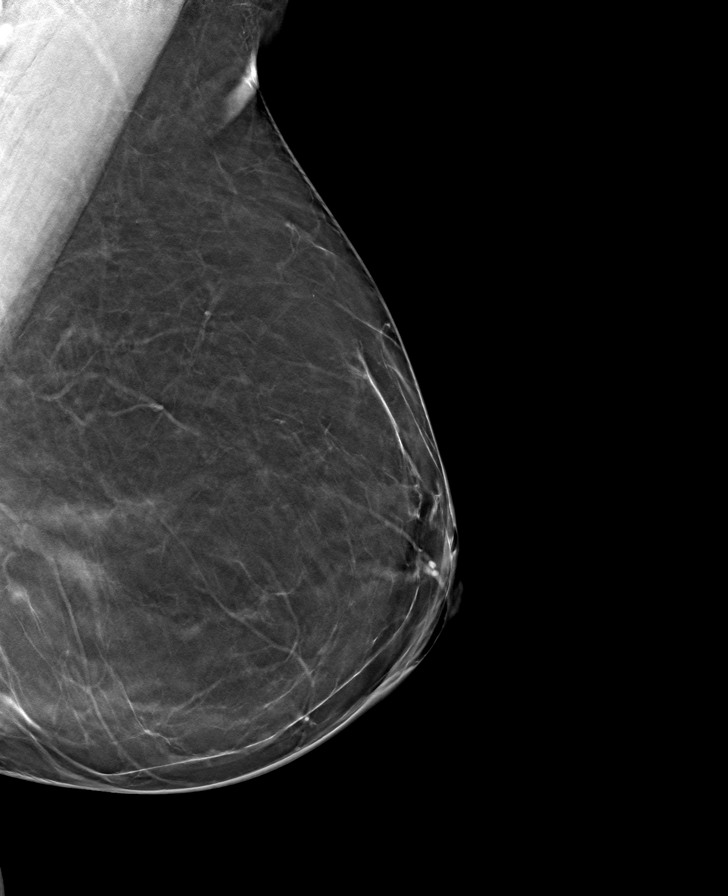

[R CC tomo · tomo slice 35/68.0]
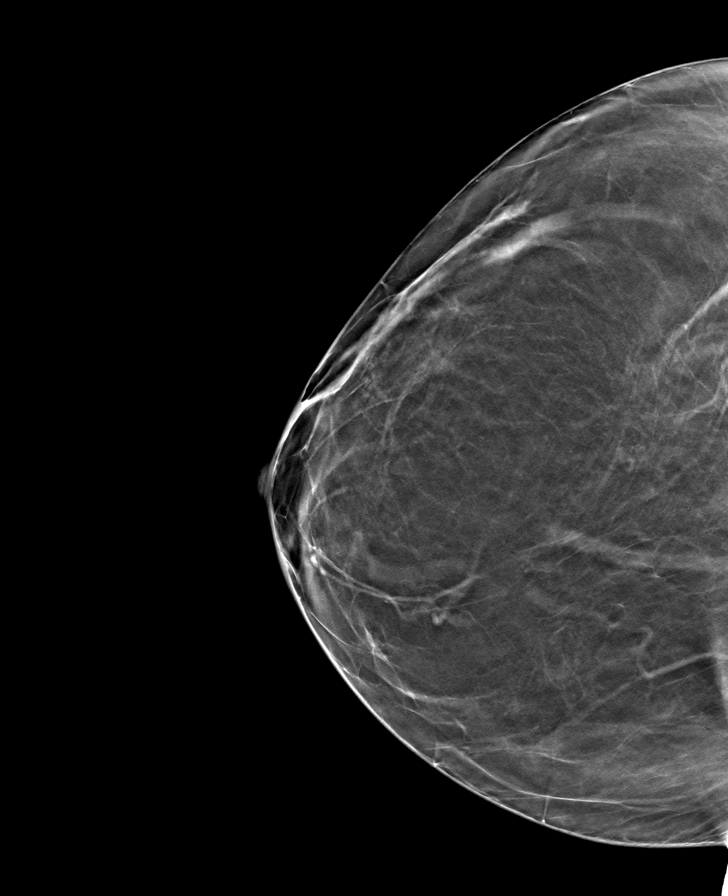

[L CC tomo · tomo slice 33/66.0]
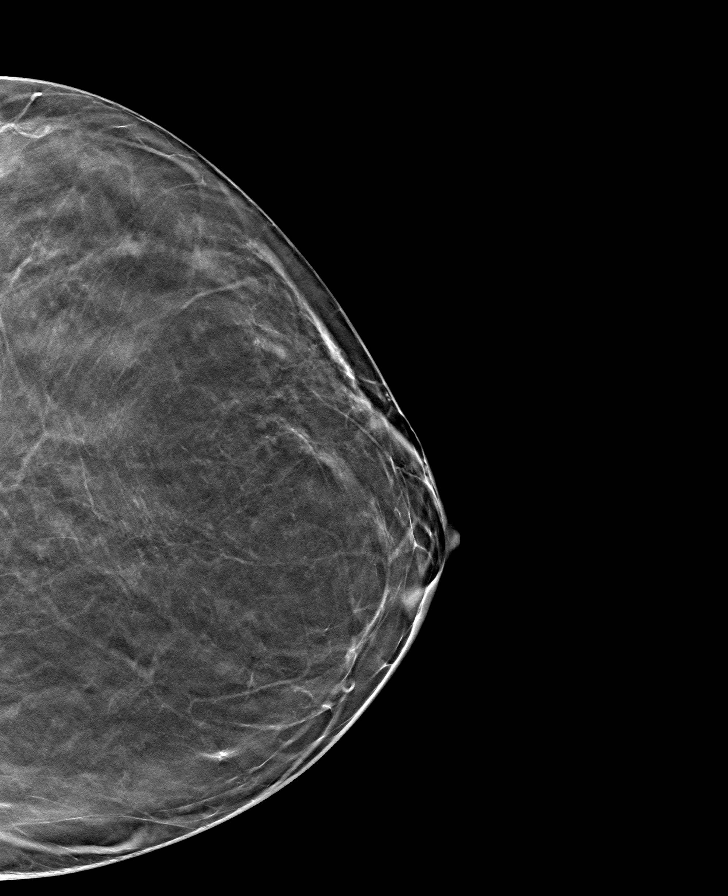

[8 of 24 positions shown; findings below may reference images not displayed]

ACR Breast Density Category b: There are scattered areas of
fibroglandular density.
FINDINGS: There are no findings suspicious for malignancy.
IMPRESSION: No mammographic evidence of malignancy. A result letter of this
screening mammogram will be mailed directly to the patient.

RECOMMENDATION:
Screening mammogram in one year. (Code:51-O-LD2)

BI-RADS CATEGORY  1: Negative.
